# Patient Record
Sex: Female | Born: 1955
Health system: Southern US, Community
[De-identification: ages and names within clinical notes are randomized; demographics above are authoritative.]

## PROBLEM LIST (undated history)

## (undated) DIAGNOSIS — F32A Depression, unspecified: Secondary | ICD-10-CM

## (undated) DIAGNOSIS — K219 Gastro-esophageal reflux disease without esophagitis: Secondary | ICD-10-CM

## (undated) DIAGNOSIS — I10 Essential (primary) hypertension: Secondary | ICD-10-CM

## (undated) DIAGNOSIS — R0989 Other specified symptoms and signs involving the circulatory and respiratory systems: Secondary | ICD-10-CM

## (undated) DIAGNOSIS — M5126 Other intervertebral disc displacement, lumbar region: Secondary | ICD-10-CM

## (undated) DIAGNOSIS — R7303 Prediabetes: Secondary | ICD-10-CM

## (undated) DIAGNOSIS — K589 Irritable bowel syndrome without diarrhea: Secondary | ICD-10-CM

## (undated) DIAGNOSIS — E785 Hyperlipidemia, unspecified: Secondary | ICD-10-CM

## (undated) DIAGNOSIS — M1990 Unspecified osteoarthritis, unspecified site: Secondary | ICD-10-CM

## (undated) DIAGNOSIS — F329 Major depressive disorder, single episode, unspecified: Secondary | ICD-10-CM

## (undated) DIAGNOSIS — J45909 Unspecified asthma, uncomplicated: Secondary | ICD-10-CM

## (undated) DIAGNOSIS — R002 Palpitations: Secondary | ICD-10-CM

## (undated) DIAGNOSIS — J40 Bronchitis, not specified as acute or chronic: Secondary | ICD-10-CM

## (undated) DIAGNOSIS — R0609 Other forms of dyspnea: Secondary | ICD-10-CM

## (undated) DIAGNOSIS — F419 Anxiety disorder, unspecified: Secondary | ICD-10-CM

## (undated) DIAGNOSIS — K625 Hemorrhage of anus and rectum: Secondary | ICD-10-CM

## (undated) DIAGNOSIS — C50912 Malignant neoplasm of unspecified site of left female breast: Secondary | ICD-10-CM

## (undated) DIAGNOSIS — R519 Headache, unspecified: Secondary | ICD-10-CM

## (undated) DIAGNOSIS — R51 Headache: Secondary | ICD-10-CM

## (undated) HISTORY — DX: Palpitations: R00.2

## (undated) HISTORY — DX: Depression, unspecified: F32.A

## (undated) HISTORY — DX: Other specified symptoms and signs involving the circulatory and respiratory systems: R09.89

## (undated) HISTORY — DX: Irritable bowel syndrome, unspecified: K58.9

## (undated) HISTORY — DX: Hyperlipidemia, unspecified: E78.5

## (undated) HISTORY — PX: BREAST SURGERY: SHX581

## (undated) HISTORY — DX: Essential (primary) hypertension: I10

## (undated) HISTORY — DX: Other forms of dyspnea: R09.89

## (undated) HISTORY — DX: Anxiety disorder, unspecified: F41.9

## (undated) HISTORY — DX: Malignant neoplasm of unspecified site of left female breast: C50.912

## (undated) HISTORY — PX: TOE SURGERY: SHX1073

## (undated) HISTORY — DX: Hemorrhage of anus and rectum: K62.5

## (undated) HISTORY — PX: SHOULDER ARTHROSCOPY W/ LABRAL REPAIR: SHX2399

## (undated) HISTORY — PX: VAGINAL HYSTERECTOMY: SUR661

## (undated) HISTORY — DX: Major depressive disorder, single episode, unspecified: F32.9

## (undated) HISTORY — DX: Other forms of dyspnea: R06.09

---

## 2003-08-09 DIAGNOSIS — C50912 Malignant neoplasm of unspecified site of left female breast: Secondary | ICD-10-CM

## 2003-08-09 HISTORY — DX: Malignant neoplasm of unspecified site of left female breast: C50.912

## 2003-09-14 ENCOUNTER — Ambulatory Visit: Admission: RE | Admit: 2003-09-14 | Discharge: 2003-12-13 | Payer: Self-pay | Admitting: Radiation Oncology

## 2006-09-02 ENCOUNTER — Ambulatory Visit (HOSPITAL_COMMUNITY): Payer: Self-pay | Admitting: Psychiatry

## 2010-09-20 ENCOUNTER — Emergency Department (HOSPITAL_COMMUNITY)
Admission: EM | Admit: 2010-09-20 | Discharge: 2010-09-20 | Disposition: A | Discharge status: Home / Self Care | Payer: 59 | Attending: Emergency Medicine | Admitting: Emergency Medicine

## 2010-09-20 DIAGNOSIS — R002 Palpitations: Secondary | ICD-10-CM | POA: Insufficient documentation

## 2010-09-20 DIAGNOSIS — R21 Rash and other nonspecific skin eruption: Secondary | ICD-10-CM | POA: Insufficient documentation

## 2010-09-20 DIAGNOSIS — Z79899 Other long term (current) drug therapy: Secondary | ICD-10-CM | POA: Insufficient documentation

## 2010-09-20 DIAGNOSIS — R9431 Abnormal electrocardiogram [ECG] [EKG]: Secondary | ICD-10-CM | POA: Insufficient documentation

## 2010-09-20 DIAGNOSIS — R000 Tachycardia, unspecified: Secondary | ICD-10-CM | POA: Insufficient documentation

## 2010-09-20 DIAGNOSIS — L509 Urticaria, unspecified: Secondary | ICD-10-CM | POA: Insufficient documentation

## 2010-09-20 LAB — BASIC METABOLIC PANEL
BUN: 15 mg/dL (ref 6–23)
CO2: 29 mEq/L (ref 19–32)
Chloride: 103 mEq/L (ref 96–112)
Creatinine, Ser: 0.53 mg/dL (ref 0.4–1.2)
Potassium: 4.1 mEq/L (ref 3.5–5.1)

## 2010-09-20 LAB — DIFFERENTIAL
Basophils Relative: 0 % (ref 0–1)
Eosinophils Absolute: 0.2 10*3/uL (ref 0.0–0.7)
Eosinophils Relative: 1 % (ref 0–5)
Lymphocytes Relative: 28 % (ref 12–46)
Monocytes Absolute: 1.5 10*3/uL — ABNORMAL HIGH (ref 0.1–1.0)
Neutro Abs: 9.1 10*3/uL — ABNORMAL HIGH (ref 1.7–7.7)
Neutrophils Relative %: 61 % (ref 43–77)
Smear Review: ADEQUATE

## 2010-09-20 LAB — CBC
HCT: 40.6 % (ref 36.0–46.0)
Hemoglobin: 13.8 g/dL (ref 12.0–15.0)
MCV: 86.9 fL (ref 78.0–100.0)
RDW: 13.3 % (ref 11.5–15.5)
WBC: 15 10*3/uL — ABNORMAL HIGH (ref 4.0–10.5)

## 2010-09-20 LAB — POCT CARDIAC MARKERS

## 2010-10-12 ENCOUNTER — Encounter: Payer: Self-pay | Admitting: Cardiology

## 2010-10-29 ENCOUNTER — Encounter: Payer: Self-pay | Admitting: Cardiology

## 2010-10-30 ENCOUNTER — Encounter: Payer: Self-pay | Admitting: *Deleted

## 2010-10-30 ENCOUNTER — Ambulatory Visit (INDEPENDENT_AMBULATORY_CARE_PROVIDER_SITE_OTHER): Payer: 59 | Admitting: Cardiology

## 2010-10-30 ENCOUNTER — Encounter: Payer: Self-pay | Admitting: Cardiology

## 2010-10-30 VITALS — BP 119/86 | HR 108 | Ht 67.0 in | Wt 180.0 lb

## 2010-10-30 DIAGNOSIS — R002 Palpitations: Secondary | ICD-10-CM | POA: Insufficient documentation

## 2010-10-30 DIAGNOSIS — R931 Abnormal findings on diagnostic imaging of heart and coronary circulation: Secondary | ICD-10-CM | POA: Insufficient documentation

## 2010-10-30 DIAGNOSIS — I10 Essential (primary) hypertension: Secondary | ICD-10-CM | POA: Insufficient documentation

## 2010-10-30 DIAGNOSIS — R9389 Abnormal findings on diagnostic imaging of other specified body structures: Secondary | ICD-10-CM

## 2010-10-30 NOTE — Assessment & Plan Note (Signed)
She had some trivial valve regurgitation noted on her echo. This is not significant and needs no specific followup.

## 2010-10-30 NOTE — Assessment & Plan Note (Signed)
She has borderline blood pressure issues but actually is running low currently.

## 2010-10-30 NOTE — Assessment & Plan Note (Signed)
At this point she appears to have sinus tachycardia. She's had an evaluation. I think she can be managed with a low dose of beta blocker as she is. She might need to have this titrated upward or be started on Cardizem in addition to orient place of beta blocker if she has continued symptoms.

## 2010-10-30 NOTE — Progress Notes (Signed)
HPI Patient presents for evaluation of tachycardia palpitations. She has no prior cardiac history. However, she was noted to have an increased heart rate. She wore a monitor in April demonstrating occasional rates into the 150s here it I reviewed this and this appears to be sinus tachycardia. She says that this happened spontaneously. It has been happening for a couple of months. It might be several times per day. He did improve after she was started on an increased dose of atenolol. She has been switched to metoprolol. She was once seen in the emergency room and was not found at any significant dysrhythmias. I reviewed an echocardiogram done recently and it demonstrated a preserved ejection fraction with no significant valvular abnormalities. She says these episodes at rest and she cannot bring them on. They might last for 30-60 minutes. She does not describe presyncope or syncope. She's not reported chest pressure, neck or arm discomfort. I reviewed electrolytes and these were normal. She did have a normal TSH by her report.  Allergies  Allergen Reactions  . Iodinated Diagnostic Agents   . Ancef (Cefazolin Sodium) Hives and Itching    Current Outpatient Prescriptions  Medication Sig Dispense Refill  . amitriptyline (ELAVIL) 50 MG tablet Take 25 mg by mouth at bedtime.       . Calcium Carbonate (CALTRATE 600 PO) Take 2 tablets by mouth daily.        . metoprolol (TOPROL-XL) 50 MG 24 hr tablet Take 50 mg by mouth daily.        . Multiple Vitamins-Minerals (CENTRUM PO) Take 1 tablet by mouth daily.        . sertraline (ZOLOFT) 50 MG tablet Take 75 mg by mouth daily.        Marland Kitchen DISCONTD: sertraline (ZOLOFT) 100 MG tablet Take 150 mg by mouth daily.       Marland Kitchen DISCONTD: ergocalciferol (VITAMIN D2) 50000 UNITS capsule Take 50,000 Units by mouth once a week.        Marland Kitchen DISCONTD: nystatin-triamcinolone (MYCOLOG II) cream Apply 1 application topically 2 (two) times daily.        Marland Kitchen DISCONTD: Omega-3 Fatty Acids  (FISH OIL) 1200 MG CAPS Take 1 capsule by mouth daily.        Marland Kitchen DISCONTD: raloxifene (EVISTA) 60 MG tablet Take 60 mg by mouth daily.        Marland Kitchen DISCONTD: Red Yeast Rice 600 MG TABS Take 1 tablet by mouth 2 (two) times daily.          Past Medical History  Diagnosis Date  . Palpitations   . Other dyspnea and respiratory abnormality   . Cancer of left breast 08/2003    under care of Dr. Cleone Slim  . HTN (hypertension)   . Hyperlipidemia     Past Surgical History  Procedure Date  . Breast surgery     CANCER DIAG 08-2003  . Toe surgery   . Vaginal hysterectomy     Family History  Problem Relation Age of Onset  . Atrial fibrillation Mother     Pacemaker    History   Social History  . Marital Status: Single    Spouse Name: RONALD    Number of Children: 1  . Years of Education: N/A   Occupational History  . [employer]   .     Social History Main Topics  . Smoking status: Former Smoker -- 1.0 packs/day for 35 years    Types: Cigarettes    Quit date: 05/10/2008  .  Smokeless tobacco: Never Used  . Alcohol Use: Not on file  . Drug Use: Not on file  . Sexually Active: Not on file   Other Topics Concern  . Not on file   Social History Narrative  . No narrative on file    ROS:  As stated in the HPI and negative for all other systems.   PHYSICAL EXAM BP 119/86  Pulse 108  Ht 5\' 7"  (1.702 m)  Wt 180 lb (81.647 kg)  BMI 28.19 kg/m2 GENERAL:  Well appearing HEENT:  Pupils equal round and reactive, fundi not visualized, oral mucosa unremarkable NECK:  No jugular venous distention, waveform within normal limits, carotid upstroke brisk and symmetric, no bruits, no thyromegaly LYMPHATICS:  No cervical, inguinal adenopathy LUNGS:  Clear to auscultation bilaterally BACK:  No CVA tenderness CHEST:  Unremarkable HEART:  PMI not displaced or sustained,S1 and S2 within normal limits, no S3, no S4, no clicks, no rubs, no murmurs ABD:  Flat, positive bowel sounds normal in  frequency in pitch, no bruits, no rebound, no guarding, no midline pulsatile mass, no hepatomegaly, no splenomegaly EXT:  2 plus pulses throughout, no edema, no cyanosis no clubbing SKIN:  No rashes no nodules NEURO:  Cranial nerves II through XII grossly intact, motor grossly intact throughout PSYCH:  Cognitively intact, oriented to person place and time   EKG:  09/20/2010  100 sinus.  Borderline QT prolongation. Otherwise unremarkable.  ASSESSMENT AND PLAN

## 2010-10-30 NOTE — Patient Instructions (Signed)
Continue all current medications. Your physician wants you to follow up in:  4 months.  You will receive a reminder letter in the mail one-two months in advance.  If you don't receive a letter, please call our office to schedule the follow up appointment   

## 2011-03-14 ENCOUNTER — Ambulatory Visit (INDEPENDENT_AMBULATORY_CARE_PROVIDER_SITE_OTHER): Payer: 59 | Admitting: Cardiovascular Disease

## 2011-03-14 DIAGNOSIS — I10 Essential (primary) hypertension: Secondary | ICD-10-CM

## 2011-03-14 DIAGNOSIS — R002 Palpitations: Secondary | ICD-10-CM

## 2011-03-14 NOTE — Assessment & Plan Note (Signed)
followed by Dr Sherril Croon

## 2011-03-14 NOTE — Assessment & Plan Note (Signed)
No further cardiac workup, at this point in time. Patient continues to do well on current dose of Toprol, reporting breakthrough palpitations, only on rare occasion. Therefore, we will have her return to the clinic to f/u with Dr Kirke Corin, on an as needed basis.

## 2011-03-14 NOTE — Progress Notes (Signed)
HPI:  Patient returns for scheduled visit.  Since her initial referral here to Dr Antoine Poche, back in May, for evaluation of palpitations, she reports no significant change from her baseline. She continues to have palpitations only on rare occasion, and is tolerating the current dose of Toprol. No medication adjustments were made, by Dr Antoine Poche, at time of her initial visit.  She denies any interim development of DOE or exertional CP.  Allergies  Allergen Reactions  . Iodinated Diagnostic Agents   . Ancef (Cefazolin Sodium) Hives and Itching    Current Outpatient Prescriptions on File Prior to Visit  Medication Sig Dispense Refill  . amitriptyline (ELAVIL) 50 MG tablet Take 25 mg by mouth at bedtime.       . Calcium Carbonate (CALTRATE 600 PO) Take 2 tablets by mouth daily.        . metoprolol (TOPROL-XL) 50 MG 24 hr tablet Take 50 mg by mouth daily.        . Multiple Vitamins-Minerals (CENTRUM PO) Take 1 tablet by mouth daily.        . sertraline (ZOLOFT) 50 MG tablet Take 75 mg by mouth daily.          Past Medical History  Diagnosis Date  . Palpitations   . Other dyspnea and respiratory abnormality   . Cancer of left breast 08/2003    under care of Dr. Cleone Slim  . HTN (hypertension)   . Hyperlipidemia     Past Surgical History  Procedure Date  . Breast surgery     CANCER DIAG 08-2003  . Toe surgery   . Vaginal hysterectomy     History   Social History  . Marital Status: Single    Spouse Name: RONALD    Number of Children: 1  . Years of Education: N/A   Occupational History  . [employer]   .     Social History Main Topics  . Smoking status: Former Smoker -- 1.0 packs/day for 35 years    Types: Cigarettes    Quit date: 05/10/2008  . Smokeless tobacco: Never Used  . Alcohol Use: Not on file  . Drug Use: Not on file  . Sexually Active: Not on file   Other Topics Concern  . Not on file   Social History Narrative  . No narrative on file    Family History    Problem Relation Age of Onset  . Atrial fibrillation Mother     Pacemaker    ROS: Negative for exertional chest pain, DOE, orthopnea, PND, lower extremity edema, palpitations, presyncope/syncope, claudication, reflux, hematuria, hematochezia, or melena. Remaining systems reviewed, and are negative.  PHYSICAL EXAM:  BP 118/81  Pulse 81  Ht 5\' 7"  (1.702 m)  Wt 180 lb 12.8 oz (82.01 kg)  BMI 28.32 kg/m2  GENERAL: well-nourished, well-developed; NAD HEENT: NCAT, PERRLA, EOMI; sclera clear; no xanthelasma NECK: palpable bilateral carotid pulses, no bruits; no JVD; no TM LUNGS: CTA bilaterally CARDIAC: RRR (S1, S2); no significant murmurs; no rubs or gallops ABDOMEN: soft, non-tender; intact BS EXTREMETIES: intact distal pulses; no significant peripheral edema SKIN: warm/dry; no obvious rash/lesions MUSCULOSKELETAL: no joint deformity NEURO: no focal deficit; NL affect   EKG:    ASSESSMENT & PLAN:

## 2011-03-14 NOTE — Patient Instructions (Signed)
   Follow up as needed. Your physician recommends that you continue on your current medications as directed. Please refer to the Current Medication list given to you today. 

## 2012-02-20 ENCOUNTER — Ambulatory Visit (INDEPENDENT_AMBULATORY_CARE_PROVIDER_SITE_OTHER): Payer: 59 | Admitting: Internal Medicine

## 2012-02-25 ENCOUNTER — Ambulatory Visit (INDEPENDENT_AMBULATORY_CARE_PROVIDER_SITE_OTHER): Payer: 59 | Admitting: Internal Medicine

## 2012-03-10 ENCOUNTER — Ambulatory Visit (INDEPENDENT_AMBULATORY_CARE_PROVIDER_SITE_OTHER): Payer: 59 | Admitting: Internal Medicine

## 2012-03-10 ENCOUNTER — Encounter (INDEPENDENT_AMBULATORY_CARE_PROVIDER_SITE_OTHER): Payer: Self-pay | Admitting: Internal Medicine

## 2012-03-10 VITALS — BP 112/82 | HR 66 | Temp 98.0°F | Ht 67.0 in | Wt 183.9 lb

## 2012-03-10 DIAGNOSIS — K589 Irritable bowel syndrome without diarrhea: Secondary | ICD-10-CM

## 2012-03-10 DIAGNOSIS — R197 Diarrhea, unspecified: Secondary | ICD-10-CM

## 2012-03-10 MED ORDER — DICYCLOMINE HCL 10 MG PO CAPS: 10.0000 mg | ORAL_CAPSULE | Freq: Three times a day (TID) | ORAL | Status: DC

## 2012-03-10 NOTE — Progress Notes (Signed)
Subjective:     Patient ID: Kaitlyn Santana, female   DOB: Feb 09, 1956, 56 y.o.   MRN: [id number]  HPI Kaitlyn Santana presents with c/o diarrhea and crampy abdominal pain. She says she cannot hold her stool at times. She actually cannot get to the bathroom and she will become incontient. She is having 3-4 stools a day.   Her last colonoscopy was in 201 for non bloody diarrhea. : Normal terminal ileum. Scattered diverticula at sigmoid and descending colon. Small polyp ablated. No endoscopic evidence of microscopic  Colitis.  She was prescribed an Rx for Dicyclomine.   Given an Rx for Dicyclomine and she has been taking one a day which has helped some. Stool studies have been negative in the past.    Review of Systems see hpi Current Outpatient Prescriptions  Medication Sig Dispense Refill  . amitriptyline (ELAVIL) 50 MG tablet Take 25 mg by mouth at bedtime.       . meloxicam (MOBIC) 7.5 MG tablet Take 7.5 mg by mouth daily as needed.        . metoprolol (TOPROL-XL) 50 MG 24 hr tablet Take 50 mg by mouth daily.        . sertraline (ZOLOFT) 50 MG tablet Take 75 mg by mouth daily.         Past Medical History  Diagnosis Date  . Palpitations   . Other dyspnea and respiratory abnormality   . Cancer of left breast 08/2003    under care of Dr. Cleone Slim  . HTN (hypertension)   . Hyperlipidemia    Family Status  Relation Status Death Age  . Mother Alive     COPD  . Father Alive     good health   History   Social History  . Marital Status: Married    Spouse Name: RONALD    Number of Children: 1  . Years of Education: N/A   Occupational History  . [employer]   .     Social History Main Topics  . Smoking status: Former Smoker -- 1.0 packs/day for 35 years    Types: Cigarettes    Quit date: 05/10/2008  . Smokeless tobacco: Never Used   Comment: Quit in 2009  . Alcohol Use: Not on file  . Drug Use: Not on file  . Sexually Active: Not on file   Other Topics Concern  . Not on file    Social History Narrative  . No narrative on file   Past Surgical History  Procedure Date  . Breast surgery     CANCER DIAG 08-2003  . Toe surgery   . Vaginal hysterectomy    Allergies  Allergen Reactions  . Iodinated Diagnostic Agents   . Ancef (Cefazolin Sodium) Hives and Itching        Objective:   Physical Exam Filed Vitals:   03/10/12 1537  BP: 112/82  Pulse: 66  Temp: 98 F (36.7 C)  Height: 5\' 7"  (1.702 m)  Weight: 183 lb 14.4 oz (83.416 kg)  Alert and oriented. Skin warm and dry. Oral mucosa is moist.   . Sclera anicteric, conjunctivae is pink. Thyroid not enlarged. No cervical lymphadenopathy. Lungs clear. Heart regular rate and rhythm.  Abdomen is soft. Bowel sounds are positive. No hepatomegaly. No abdominal masses felt. No tenderness.  No edema to lower extremities.      Assessment:    IBS diarrhea. Hx of same. Last colonoscopy in 2010 by Dr. Karilyn Cota did not reveal microscopic colitis.  Plan:    Dicyclomine 10mg  TID with Meals. PR in 1 months. OV prn.

## 2012-03-10 NOTE — Patient Instructions (Addendum)
Dicyclomine TID 10mg . OV prn basis.

## 2012-03-18 ENCOUNTER — Encounter (INDEPENDENT_AMBULATORY_CARE_PROVIDER_SITE_OTHER): Payer: Self-pay

## 2013-02-17 ENCOUNTER — Encounter (HOSPITAL_COMMUNITY): Payer: Self-pay | Admitting: Psychiatry

## 2013-02-17 ENCOUNTER — Ambulatory Visit (INDEPENDENT_AMBULATORY_CARE_PROVIDER_SITE_OTHER): Payer: 59 | Admitting: Psychiatry

## 2013-02-17 VITALS — BP 150/98 | Ht 67.0 in | Wt 188.0 lb

## 2013-02-17 DIAGNOSIS — F339 Major depressive disorder, recurrent, unspecified: Secondary | ICD-10-CM | POA: Insufficient documentation

## 2013-02-17 DIAGNOSIS — F4310 Post-traumatic stress disorder, unspecified: Secondary | ICD-10-CM

## 2013-02-17 MED ORDER — BUPROPION HCL ER (XL) 150 MG PO TB24: 150.0000 mg | ORAL_TABLET | ORAL | Status: DC
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
## 2013-02-22 ENCOUNTER — Telehealth (HOSPITAL_COMMUNITY): Payer: Self-pay | Admitting: Psychiatry

## 2013-03-01 ENCOUNTER — Ambulatory Visit (INDEPENDENT_AMBULATORY_CARE_PROVIDER_SITE_OTHER): Payer: 59 | Admitting: Psychiatry

## 2013-03-01 DIAGNOSIS — F339 Major depressive disorder, recurrent, unspecified: Secondary | ICD-10-CM
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
## 2013-03-04 NOTE — Patient Instructions (Signed)
Discussed orally 

## 2013-03-09 ENCOUNTER — Ambulatory Visit (HOSPITAL_COMMUNITY): Payer: Self-pay | Admitting: Psychiatry

## 2013-03-10 ENCOUNTER — Ambulatory Visit (INDEPENDENT_AMBULATORY_CARE_PROVIDER_SITE_OTHER): Payer: 59 | Admitting: Psychiatry

## 2013-03-10 ENCOUNTER — Encounter (HOSPITAL_COMMUNITY): Payer: Self-pay | Admitting: Psychiatry

## 2013-03-10 VITALS — BP 128/90 | Ht 68.0 in | Wt 188.0 lb

## 2013-03-10 DIAGNOSIS — F339 Major depressive disorder, recurrent, unspecified: Secondary | ICD-10-CM

## 2013-03-10 DIAGNOSIS — F4310 Post-traumatic stress disorder, unspecified: Secondary | ICD-10-CM
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
## 2013-03-16 ENCOUNTER — Telehealth (HOSPITAL_COMMUNITY): Payer: Self-pay | Admitting: *Deleted

## 2013-03-19 ENCOUNTER — Ambulatory Visit (INDEPENDENT_AMBULATORY_CARE_PROVIDER_SITE_OTHER): Payer: 59 | Admitting: Psychology

## 2013-03-19 ENCOUNTER — Encounter (HOSPITAL_COMMUNITY): Payer: Self-pay | Admitting: Psychology

## 2013-03-19 DIAGNOSIS — F339 Major depressive disorder, recurrent, unspecified: Secondary | ICD-10-CM
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
## 2013-03-31 ENCOUNTER — Encounter (HOSPITAL_COMMUNITY): Payer: Self-pay | Admitting: Psychiatry

## 2013-03-31 ENCOUNTER — Ambulatory Visit (INDEPENDENT_AMBULATORY_CARE_PROVIDER_SITE_OTHER): Payer: 59 | Admitting: Psychiatry

## 2013-03-31 VITALS — BP 130/100 | Ht 68.0 in | Wt 189.0 lb

## 2013-03-31 DIAGNOSIS — F4310 Post-traumatic stress disorder, unspecified: Secondary | ICD-10-CM

## 2013-03-31 DIAGNOSIS — F339 Major depressive disorder, recurrent, unspecified: Secondary | ICD-10-CM

## 2013-03-31 MED ORDER — CLONAZEPAM 0.5 MG PO TABS: 0.5000 mg | ORAL_TABLET | Freq: Two times a day (BID) | ORAL | Status: DC
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
## 2013-04-01 ENCOUNTER — Ambulatory Visit (HOSPITAL_COMMUNITY): Payer: Self-pay | Admitting: Psychology

## 2013-04-06 ENCOUNTER — Encounter (HOSPITAL_COMMUNITY): Payer: Self-pay | Admitting: Psychology

## 2013-04-06 ENCOUNTER — Ambulatory Visit (INDEPENDENT_AMBULATORY_CARE_PROVIDER_SITE_OTHER): Payer: 59 | Admitting: Psychology

## 2013-04-06 DIAGNOSIS — F339 Major depressive disorder, recurrent, unspecified: Secondary | ICD-10-CM
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
## 2013-04-21 ENCOUNTER — Ambulatory Visit (HOSPITAL_COMMUNITY): Payer: Self-pay | Admitting: Psychiatry

## 2013-04-21 ENCOUNTER — Ambulatory Visit (INDEPENDENT_AMBULATORY_CARE_PROVIDER_SITE_OTHER): Payer: 59 | Admitting: Psychiatry

## 2013-04-21 ENCOUNTER — Encounter (HOSPITAL_COMMUNITY): Payer: Self-pay | Admitting: Psychiatry

## 2013-04-21 VITALS — BP 140/80 | Ht 68.0 in | Wt 190.0 lb

## 2013-04-21 DIAGNOSIS — F4310 Post-traumatic stress disorder, unspecified: Secondary | ICD-10-CM

## 2013-04-21 DIAGNOSIS — F339 Major depressive disorder, recurrent, unspecified: Secondary | ICD-10-CM

## 2013-04-21 MED ORDER — CLONAZEPAM 0.5 MG PO TABS: 0.5000 mg | ORAL_TABLET | Freq: Two times a day (BID) | ORAL | Status: DC

## 2013-04-21 MED ORDER — FLUOXETINE HCL 40 MG PO CAPS: 40.0000 mg | ORAL_CAPSULE | Freq: Every day | ORAL | Status: DC
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
## 2013-04-23 ENCOUNTER — Ambulatory Visit (HOSPITAL_COMMUNITY): Payer: Self-pay | Admitting: Psychology

## 2013-05-05 ENCOUNTER — Ambulatory Visit (HOSPITAL_COMMUNITY): Payer: Self-pay | Admitting: Psychology

## 2013-05-17 ENCOUNTER — Encounter (HOSPITAL_COMMUNITY): Payer: Self-pay | Admitting: Psychiatry

## 2013-05-17 ENCOUNTER — Ambulatory Visit (INDEPENDENT_AMBULATORY_CARE_PROVIDER_SITE_OTHER): Payer: 59 | Admitting: Psychiatry

## 2013-05-17 VITALS — BP 120/82 | Ht 68.0 in | Wt 190.0 lb

## 2013-05-17 DIAGNOSIS — F339 Major depressive disorder, recurrent, unspecified: Secondary | ICD-10-CM

## 2013-05-17 DIAGNOSIS — F4310 Post-traumatic stress disorder, unspecified: Secondary | ICD-10-CM
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
## 2013-05-24 ENCOUNTER — Ambulatory Visit (HOSPITAL_COMMUNITY): Payer: Self-pay | Admitting: Psychology

## 2013-06-28 ENCOUNTER — Ambulatory Visit (INDEPENDENT_AMBULATORY_CARE_PROVIDER_SITE_OTHER): Payer: 59 | Admitting: Psychiatry

## 2013-06-28 ENCOUNTER — Encounter (HOSPITAL_COMMUNITY): Payer: Self-pay | Admitting: Psychiatry

## 2013-06-28 VITALS — BP 130/84 | Ht 68.0 in | Wt 189.0 lb

## 2013-06-28 DIAGNOSIS — F339 Major depressive disorder, recurrent, unspecified: Secondary | ICD-10-CM

## 2013-06-28 DIAGNOSIS — F4310 Post-traumatic stress disorder, unspecified: Secondary | ICD-10-CM

## 2013-06-28 MED ORDER — CLONAZEPAM 0.5 MG PO TABS: 0.5000 mg | ORAL_TABLET | Freq: Two times a day (BID) | ORAL | Status: DC

## 2013-06-28 MED ORDER — FLUOXETINE HCL 40 MG PO CAPS: 40.0000 mg | ORAL_CAPSULE | Freq: Every day | ORAL | Status: DC

## 2013-06-28 MED ORDER — AMITRIPTYLINE HCL 50 MG PO TABS: 25.0000 mg | ORAL_TABLET | Freq: Every day | ORAL | Status: DC
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
## 2013-08-24 ENCOUNTER — Ambulatory Visit (HOSPITAL_COMMUNITY): Payer: Self-pay | Admitting: Psychiatry

## 2013-08-24 ENCOUNTER — Ambulatory Visit (INDEPENDENT_AMBULATORY_CARE_PROVIDER_SITE_OTHER): Payer: 59 | Admitting: Psychiatry

## 2013-08-24 ENCOUNTER — Encounter (HOSPITAL_COMMUNITY): Payer: Self-pay | Admitting: Psychiatry

## 2013-08-24 VITALS — BP 110/80 | Ht 68.0 in | Wt 188.0 lb

## 2013-08-24 DIAGNOSIS — F4310 Post-traumatic stress disorder, unspecified: Secondary | ICD-10-CM

## 2013-08-24 DIAGNOSIS — F339 Major depressive disorder, recurrent, unspecified: Secondary | ICD-10-CM

## 2013-08-24 MED ORDER — CLONAZEPAM 0.5 MG PO TABS: 0.5000 mg | ORAL_TABLET | Freq: Two times a day (BID) | ORAL | Status: DC

## 2013-08-24 MED ORDER — AMITRIPTYLINE HCL 50 MG PO TABS
25.0000 mg | ORAL_TABLET | Freq: Every day | ORAL | Status: DC
Start: 2013-08-24 — End: 2013-12-17

## 2013-08-24 MED ORDER — FLUOXETINE HCL 40 MG PO CAPS: 40.0000 mg | ORAL_CAPSULE | Freq: Every day | ORAL | Status: DC
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
## 2013-11-19 ENCOUNTER — Ambulatory Visit (HOSPITAL_COMMUNITY): Payer: Self-pay | Admitting: Psychiatry

## 2013-12-13 ENCOUNTER — Encounter (HOSPITAL_COMMUNITY): Payer: Self-pay | Admitting: Psychiatry

## 2013-12-13 ENCOUNTER — Telehealth (HOSPITAL_COMMUNITY): Payer: Self-pay | Admitting: *Deleted

## 2013-12-13 NOTE — Telephone Encounter (Signed)
sent 

## 2013-12-17 ENCOUNTER — Encounter (HOSPITAL_COMMUNITY): Payer: Self-pay | Admitting: Psychiatry

## 2013-12-17 ENCOUNTER — Ambulatory Visit (INDEPENDENT_AMBULATORY_CARE_PROVIDER_SITE_OTHER): Payer: 59 | Admitting: Psychiatry

## 2013-12-17 VITALS — BP 130/84 | Ht 68.0 in | Wt 180.0 lb

## 2013-12-17 DIAGNOSIS — F331 Major depressive disorder, recurrent, moderate: Secondary | ICD-10-CM

## 2013-12-17 MED ORDER — AMITRIPTYLINE HCL 50 MG PO TABS: 50.0000 mg | ORAL_TABLET | Freq: Every day | ORAL | Status: DC

## 2013-12-17 MED ORDER — FLUOXETINE HCL 40 MG PO CAPS: 40.0000 mg | ORAL_CAPSULE | Freq: Every day | ORAL | Status: DC

## 2013-12-17 MED ORDER — CLONAZEPAM 0.5 MG PO TABS: 0.5000 mg | ORAL_TABLET | Freq: Two times a day (BID) | ORAL | Status: DC
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
## 2014-04-08 ENCOUNTER — Other Ambulatory Visit (HOSPITAL_COMMUNITY): Payer: Self-pay | Admitting: Psychiatry

## 2014-04-29 ENCOUNTER — Encounter (HOSPITAL_COMMUNITY): Payer: Self-pay | Admitting: Psychiatry

## 2014-04-29 ENCOUNTER — Ambulatory Visit (INDEPENDENT_AMBULATORY_CARE_PROVIDER_SITE_OTHER): Payer: 59 | Admitting: Psychiatry

## 2014-04-29 VITALS — BP 105/83 | HR 83 | Ht 66.5 in | Wt 188.0 lb

## 2014-04-29 DIAGNOSIS — F4310 Post-traumatic stress disorder, unspecified: Secondary | ICD-10-CM

## 2014-04-29 DIAGNOSIS — F331 Major depressive disorder, recurrent, moderate: Secondary | ICD-10-CM

## 2014-04-29 MED ORDER — AMITRIPTYLINE HCL 50 MG PO TABS: 50.0000 mg | ORAL_TABLET | Freq: Every day | ORAL | Status: DC

## 2014-04-29 MED ORDER — FLUOXETINE HCL 40 MG PO CAPS: 40.0000 mg | ORAL_CAPSULE | Freq: Every day | ORAL | Status: DC

## 2014-04-29 MED ORDER — CLONAZEPAM 0.5 MG PO TABS: 0.5000 mg | ORAL_TABLET | Freq: Two times a day (BID) | ORAL | Status: DC
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
## 2014-08-29 ENCOUNTER — Ambulatory Visit (HOSPITAL_COMMUNITY): Payer: Self-pay | Admitting: Psychiatry

## 2014-09-12 ENCOUNTER — Other Ambulatory Visit (HOSPITAL_COMMUNITY): Payer: Self-pay | Admitting: Psychiatry

## 2014-09-13 ENCOUNTER — Encounter (HOSPITAL_COMMUNITY): Payer: Self-pay | Admitting: Psychiatry

## 2014-09-13 ENCOUNTER — Ambulatory Visit (HOSPITAL_COMMUNITY): Payer: Self-pay | Admitting: Psychiatry

## 2014-09-13 ENCOUNTER — Ambulatory Visit (INDEPENDENT_AMBULATORY_CARE_PROVIDER_SITE_OTHER): Payer: 59 | Admitting: Psychiatry

## 2014-09-13 VITALS — BP 110/82 | Ht 66.0 in | Wt 192.0 lb

## 2014-09-13 DIAGNOSIS — F4310 Post-traumatic stress disorder, unspecified: Secondary | ICD-10-CM

## 2014-09-13 DIAGNOSIS — F331 Major depressive disorder, recurrent, moderate: Secondary | ICD-10-CM

## 2014-09-13 MED ORDER — FLUOXETINE HCL 40 MG PO CAPS: 40.0000 mg | ORAL_CAPSULE | Freq: Every day | ORAL | Status: DC

## 2014-09-13 MED ORDER — CLONAZEPAM 0.5 MG PO TABS: 0.5000 mg | ORAL_TABLET | Freq: Two times a day (BID) | ORAL | Status: DC

## 2014-09-13 MED ORDER — AMITRIPTYLINE HCL 50 MG PO TABS: 50.0000 mg | ORAL_TABLET | Freq: Every day | ORAL | Status: DC
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
## 2015-01-10 ENCOUNTER — Other Ambulatory Visit (HOSPITAL_COMMUNITY): Payer: Self-pay | Admitting: Psychiatry

## 2015-01-13 ENCOUNTER — Ambulatory Visit (INDEPENDENT_AMBULATORY_CARE_PROVIDER_SITE_OTHER): Payer: 59 | Admitting: Psychiatry

## 2015-01-13 ENCOUNTER — Other Ambulatory Visit (HOSPITAL_COMMUNITY): Payer: Self-pay | Admitting: Psychiatry

## 2015-01-13 ENCOUNTER — Encounter (HOSPITAL_COMMUNITY): Payer: Self-pay | Admitting: Psychiatry

## 2015-01-13 VITALS — BP 113/82 | HR 95 | Ht 66.0 in | Wt 183.4 lb

## 2015-01-13 DIAGNOSIS — F331 Major depressive disorder, recurrent, moderate: Secondary | ICD-10-CM

## 2015-01-13 DIAGNOSIS — F4310 Post-traumatic stress disorder, unspecified: Secondary | ICD-10-CM

## 2015-01-13 MED ORDER — CLONAZEPAM 0.5 MG PO TABS: 0.5000 mg | ORAL_TABLET | Freq: Two times a day (BID) | ORAL | Status: DC

## 2015-01-13 MED ORDER — FLUOXETINE HCL 40 MG PO CAPS: 40.0000 mg | ORAL_CAPSULE | Freq: Every day | ORAL | Status: DC

## 2015-01-13 MED ORDER — AMITRIPTYLINE HCL 50 MG PO TABS: 50.0000 mg | ORAL_TABLET | Freq: Every day | ORAL | Status: DC
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
## 2015-03-15 ENCOUNTER — Ambulatory Visit (INDEPENDENT_AMBULATORY_CARE_PROVIDER_SITE_OTHER): Payer: 59 | Admitting: Psychiatry

## 2015-03-15 ENCOUNTER — Ambulatory Visit (HOSPITAL_COMMUNITY): Payer: Self-pay | Admitting: Psychiatry

## 2015-03-15 ENCOUNTER — Encounter (HOSPITAL_COMMUNITY): Payer: Self-pay | Admitting: Psychiatry

## 2015-03-15 VITALS — BP 113/78 | HR 74 | Ht 66.0 in | Wt 180.8 lb

## 2015-03-15 DIAGNOSIS — F331 Major depressive disorder, recurrent, moderate: Secondary | ICD-10-CM

## 2015-03-15 DIAGNOSIS — F4310 Post-traumatic stress disorder, unspecified: Secondary | ICD-10-CM | POA: Diagnosis not present

## 2015-03-15 MED ORDER — FLUOXETINE HCL 40 MG PO CAPS: 40.0000 mg | ORAL_CAPSULE | Freq: Every day | ORAL | Status: DC

## 2015-03-15 MED ORDER — AMITRIPTYLINE HCL 50 MG PO TABS: 50.0000 mg | ORAL_TABLET | Freq: Every day | ORAL | Status: DC

## 2015-03-15 MED ORDER — CLONAZEPAM 0.5 MG PO TABS: 0.5000 mg | ORAL_TABLET | Freq: Two times a day (BID) | ORAL | Status: DC
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
## 2015-06-14 ENCOUNTER — Telehealth (HOSPITAL_COMMUNITY): Payer: Self-pay | Admitting: *Deleted

## 2015-06-14 NOTE — Telephone Encounter (Signed)
pt called on 06-13-15 at 4:51pm and lm stating she would like to resch her appt with Dr. Harrington Challenger for 06-15-15. Called pt back at 8:10am on 06-14-15 and mother picked up. Informed mother to have pt call back and mother stated pt was sleep but will let her know.

## 2015-06-14 NOTE — Telephone Encounter (Signed)
Opened in Error.

## 2015-06-15 ENCOUNTER — Ambulatory Visit (HOSPITAL_COMMUNITY): Payer: Self-pay | Admitting: Psychiatry

## 2015-07-17 ENCOUNTER — Telehealth (HOSPITAL_COMMUNITY): Payer: Self-pay | Admitting: *Deleted

## 2015-07-17 NOTE — Telephone Encounter (Signed)
You may send in one month supply with 2 refills

## 2015-07-17 NOTE — Telephone Encounter (Signed)
Pharmacy requesting refills for pt Clonazepam 0.5 mg BID. Pt medication last printed 03-15-15 with 3 refills. Pt pharmacy number is [phone].

## 2015-07-21 ENCOUNTER — Other Ambulatory Visit (HOSPITAL_COMMUNITY): Payer: Self-pay | Admitting: Psychiatry

## 2015-07-27 ENCOUNTER — Telehealth (HOSPITAL_COMMUNITY): Payer: Self-pay | Admitting: *Deleted

## 2015-07-27 MED ORDER — CLONAZEPAM 0.5 MG PO TABS: 0.5000 mg | ORAL_TABLET | Freq: Two times a day (BID) | ORAL | Status: DC

## 2015-07-27 NOTE — Telephone Encounter (Signed)
Per Dr. Harrington Challenger to call in pt klonopin with 2 refills. Called pt pharmacy and spoke with [place].

## 2015-07-27 NOTE — Telephone Encounter (Signed)
Medication called into pharmacy and spoke with [place]

## 2015-08-07 ENCOUNTER — Encounter (HOSPITAL_COMMUNITY): Payer: Self-pay | Admitting: Psychiatry

## 2015-08-07 ENCOUNTER — Ambulatory Visit (INDEPENDENT_AMBULATORY_CARE_PROVIDER_SITE_OTHER): Payer: 59 | Admitting: Psychiatry

## 2015-08-07 VITALS — BP 117/79 | HR 101 | Ht 66.0 in | Wt 191.4 lb

## 2015-08-07 DIAGNOSIS — F331 Major depressive disorder, recurrent, moderate: Secondary | ICD-10-CM

## 2015-08-07 DIAGNOSIS — F4310 Post-traumatic stress disorder, unspecified: Secondary | ICD-10-CM

## 2015-08-07 MED ORDER — FLUOXETINE HCL 40 MG PO CAPS: 40.0000 mg | ORAL_CAPSULE | Freq: Every day | ORAL | Status: DC

## 2015-08-07 MED ORDER — AMITRIPTYLINE HCL 50 MG PO TABS: 50.0000 mg | ORAL_TABLET | Freq: Every day | ORAL | Status: DC
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
## 2015-08-29 ENCOUNTER — Ambulatory Visit (HOSPITAL_COMMUNITY): Payer: Self-pay | Admitting: Psychiatry

## 2015-09-08 ENCOUNTER — Encounter (HOSPITAL_COMMUNITY): Payer: Self-pay | Admitting: *Deleted

## 2015-09-08 ENCOUNTER — Ambulatory Visit (HOSPITAL_COMMUNITY): Payer: Self-pay | Admitting: Psychiatry

## 2015-09-13 ENCOUNTER — Ambulatory Visit (HOSPITAL_COMMUNITY): Payer: Self-pay | Admitting: Psychiatry

## 2015-09-15 ENCOUNTER — Ambulatory Visit (HOSPITAL_COMMUNITY): Payer: Self-pay | Admitting: Psychiatry

## 2015-10-02 ENCOUNTER — Telehealth (HOSPITAL_COMMUNITY): Payer: Self-pay | Admitting: *Deleted

## 2015-10-23 ENCOUNTER — Ambulatory Visit (INDEPENDENT_AMBULATORY_CARE_PROVIDER_SITE_OTHER): Payer: 59 | Admitting: Psychiatry

## 2015-10-23 ENCOUNTER — Encounter (HOSPITAL_COMMUNITY): Payer: Self-pay | Admitting: Psychiatry

## 2015-10-23 VITALS — BP 130/84 | Ht 66.0 in | Wt 191.4 lb

## 2015-10-23 DIAGNOSIS — F331 Major depressive disorder, recurrent, moderate: Secondary | ICD-10-CM

## 2015-10-23 DIAGNOSIS — F4310 Post-traumatic stress disorder, unspecified: Secondary | ICD-10-CM

## 2015-10-23 MED ORDER — FLUOXETINE HCL 40 MG PO CAPS: 40.0000 mg | ORAL_CAPSULE | Freq: Every day | ORAL | Status: DC

## 2015-10-23 MED ORDER — AMITRIPTYLINE HCL 50 MG PO TABS: 50.0000 mg | ORAL_TABLET | Freq: Every day | ORAL | Status: DC
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
## 2016-01-23 ENCOUNTER — Ambulatory Visit (HOSPITAL_COMMUNITY): Payer: Self-pay | Admitting: Psychiatry

## 2016-02-15 ENCOUNTER — Telehealth (HOSPITAL_COMMUNITY): Payer: Self-pay | Admitting: *Deleted

## 2016-02-15 NOTE — Telephone Encounter (Signed)
Called pt to inform her that provider will not be in office during appt 02-19-16. Unable to reach pt on home due to no voicemail on home number na d memory full on mobile number.

## 2016-02-19 ENCOUNTER — Ambulatory Visit (HOSPITAL_COMMUNITY): Payer: Self-pay | Admitting: Psychiatry

## 2016-02-28 ENCOUNTER — Encounter (HOSPITAL_COMMUNITY): Payer: Self-pay | Admitting: Psychiatry

## 2016-02-28 ENCOUNTER — Ambulatory Visit (INDEPENDENT_AMBULATORY_CARE_PROVIDER_SITE_OTHER): Payer: 59 | Admitting: Psychiatry

## 2016-02-28 VITALS — BP 116/79 | HR 107 | Ht 66.0 in | Wt 187.6 lb

## 2016-02-28 DIAGNOSIS — F4310 Post-traumatic stress disorder, unspecified: Secondary | ICD-10-CM | POA: Diagnosis not present

## 2016-02-28 DIAGNOSIS — F331 Major depressive disorder, recurrent, moderate: Secondary | ICD-10-CM

## 2016-02-28 MED ORDER — AMITRIPTYLINE HCL 50 MG PO TABS: 50.0000 mg | ORAL_TABLET | Freq: Every day | ORAL | 3 refills | Status: DC

## 2016-02-28 MED ORDER — CLONAZEPAM 0.5 MG PO TABS: 0.5000 mg | ORAL_TABLET | Freq: Two times a day (BID) | ORAL | 3 refills | Status: DC

## 2016-02-28 MED ORDER — FLUOXETINE HCL 40 MG PO CAPS: 40.0000 mg | ORAL_CAPSULE | Freq: Every day | ORAL | 3 refills | Status: DC
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
## 2016-06-28 ENCOUNTER — Ambulatory Visit (HOSPITAL_COMMUNITY): Payer: Self-pay | Admitting: Psychiatry

## 2016-10-02 ENCOUNTER — Encounter (INDEPENDENT_AMBULATORY_CARE_PROVIDER_SITE_OTHER): Payer: Self-pay

## 2016-10-02 ENCOUNTER — Encounter: Payer: Self-pay | Admitting: Neurology

## 2016-10-02 ENCOUNTER — Ambulatory Visit (INDEPENDENT_AMBULATORY_CARE_PROVIDER_SITE_OTHER): Payer: 59 | Admitting: Neurology

## 2016-10-02 VITALS — BP 126/81 | HR 88 | Ht 66.0 in | Wt 182.8 lb

## 2016-10-02 DIAGNOSIS — R202 Paresthesia of skin: Secondary | ICD-10-CM

## 2016-10-02 DIAGNOSIS — R200 Anesthesia of skin: Secondary | ICD-10-CM | POA: Diagnosis not present

## 2016-10-02 DIAGNOSIS — R29898 Other symptoms and signs involving the musculoskeletal system: Secondary | ICD-10-CM

## 2016-10-02 DIAGNOSIS — R269 Unspecified abnormalities of gait and mobility: Secondary | ICD-10-CM

## 2016-10-02 MED ORDER — AMITRIPTYLINE HCL 50 MG PO TABS: 100.0000 mg | ORAL_TABLET | Freq: Every day | ORAL | 3 refills | Status: DC

## 2016-10-02 NOTE — Progress Notes (Signed)
[facility] [address] [place]. [place] [address] [phone]       OFFICE CONSULT NOTE  Ms. BREE HEINZELMAN Date of Birth:  1956/04/22 Medical Record Number:  [id number]   Referring MD:  Jerene Bears Reason for Referral:  Right leg numbness ,weakness  HPI: Ms Kemler is a 61 year Caucasian lady who started having right leg numbness weakness 2 weeks ago. She states she is walking to work when all of a sudden the right leg became numb from the hip to toe. Initially she said the whole leg was numb but later on during the visit when asked to further characterize it she states that it was more lateral aspect of the right, thigh and foot. She also had some subjective weakness lasted for a few days she is asked to be seen in the emergency room at [facility]. Code stroke was called and she was seen by teleneurologist  Dr. Terrill Mohr noted NIH stroke scale of 5 with right leg weakness and numbness. CT angiogram of the brain was obtained that showed no large vessel stenosis or occlusion. MRI is pending. I have personally reviewed imaging films thru [facility] P[facility] and reviewed records thru care everywhere . MRI did not show an acute stroke only mild changes of small vessel disease. Patient subsequently had carotid ultrasound done which was also unremarkable. States her weakness has improved but she continues to have intermittent tingling in the right leg. This can happen. He is several times a day. The last few months improves with rest. She has not tried any specific medications for her paresthesias. She does take Elavil 50 mg at night for migraine prevention. for several years  ROS:   14 system review of systems is positive for  numbness, leg weakness, gait difficulty and all other systems negative PMH:  Past Medical History:  Diagnosis Date  . Anxiety   . Cancer of left breast ([place]) 08/2003   under care of Dr. Sonny Dandy  . Depression   . HTN (hypertension)   . Hyperlipidemia     . Irritable bowel disease   . Other dyspnea and respiratory abnormality   . Palpitations     Social History:  Social History   Social History  . Marital status: Married    Spouse name: RONALD  . Number of children: 1  . Years of education: N/A   Occupational History  . [employer]   .  [employer]   Social History Main Topics  . Smoking status: Former Smoker    Packs/day: 1.00    Years: 35.00    Types: Cigarettes    Quit date: 05/10/2008  . Smokeless tobacco: Never Used     Comment: Quit in 2009  . Alcohol use No  . Drug use: No  . Sexual activity: Yes   Other Topics Concern  . Not on file   Social History Narrative  . No narrative on file    Medications:   Current Outpatient Prescriptions on File Prior to Visit  Medication Sig Dispense Refill  . clonazePAM (KLONOPIN) 0.5 MG tablet Take 1 tablet (0.5 mg total) by mouth 2 (two) times daily. 60 tablet 3  . FLUoxetine (PROZAC) 40 MG capsule Take 1 capsule (40 mg total) by mouth daily. 30 capsule 3  . metoprolol (TOPROL-XL) 50 MG 24 hr tablet Take 50 mg by mouth daily.      . Multiple Vitamin (MULTIVITAMIN WITH MINERALS) TABS tablet Take 1 tablet by mouth daily.    Marland Kitchen  pantoprazole (PROTONIX) 40 MG tablet Take by mouth daily.    . simvastatin (ZOCOR) 20 MG tablet Take 20 mg by mouth daily.      No current facility-administered medications on file prior to visit.     Allergies:   Allergies  Allergen Reactions  . Iodinated Diagnostic Agents   . Ancef [Cefazolin Sodium] Hives and Itching    Physical Exam General: well developed, well nourished, middle-aged Caucasian lady seated, in no evident distress Head: head normocephalic and atraumatic.   Neck: supple with no carotid or supraclavicular bruits Cardiovascular: regular rate and rhythm, no murmurs Musculoskeletal: no deformity Skin:  no rash/petichiae Vascular:  Normal pulses all extremities  Neurologic Exam Mental Status: Awake and fully alert.  Oriented to place and time. Recent and remote memory intact. Attention span, concentration and fund of knowledge appropriate. Mood and affect appropriate.  Cranial Nerves: Fundoscopic exam reveals sharp disc margins. Pupils equal, briskly reactive to light. Extraocular movements full without nystagmus. Visual fields full to confrontation. Hearing intact. Facial sensation intact. Face, tongue, palate moves normally and symmetrically.  Motor: Normal bulk and tone. Normal strength in all tested extremity muscles. Mild subjective weakness of right leg ankle dorsiflexors and toe extensors with giveaway Sensory.: intact to touch , pinprick , position and vibratory sensation. Except diminished touch pinprick and position and vibration sense over the toes on the right side only  Coordination: Rapid alternating movements normal in all extremities. Finger-to-nose and heel-to-shin performed accurately bilaterally. Gait and Station: Arises from chair without difficulty. Stance is normal. Gait demonstrates normal stride length and balance . Able to heel, toe and tandem walk without difficulty.  Reflexes: 3+ and symmetric. Toes downgoing.      ASSESSMENT: 61 year old lady with transient episode of right leg weakness now followed by intermittent right leg and hand paresthesias of unclear etiology. Neurological exam is significant only for brisk hyperreflexia and mild sensory loss in the right foot Compressive spinal lesion versus lumbar radiculopathy the differential.    PLAN: I had a long discussion with the patient and husband regarding her intermittent right leg numbness and recent episode of right leg weakness and discussed my examination findings and differential diagnosis. I recommend checking MRI of her spine rule out compressive lesions as well as check EMG nerve conduction study to look for any radiculopathy. Increase amitriptyline dose to 100 mg at night to help with paresthesias. If this is not effective  may consider adding Topamax later Patient is clearly significantly disabled from her paresthesias and gait difficulty she was asked to stay out of work next follow-up visit with the she will return for follow-up in 2 months or call earlier if necessary. Greater than 50% time during this 45 minute consultation visit was spent on counseling and coordination of care about her leg numbness, weakness and answering questions Antony Contras, MD  [facility] [address] [place], [place] [address]  Phone [phone] Fax [phone] Note: This document was prepared with digital dictation and possible smart phrase technology. Any transcriptional errors that result from this process are unintentional.

## 2016-10-02 NOTE — Patient Instructions (Signed)
I had a long discussion with the patient and husband regarding her intermittent right leg numbness and recent episode of right leg weakness and discussed my examination findings and differential diagnosis. I recommend checking MRI of her spine rule out compressive lesions as well as check EMG nerve conduction study to look for any radiculopathy. Increase amitriptyline dose to 100 mg at night to help with paresthesias. If this is not effective may consider adding Topamax later Patient is clearly significantly disabled from her paresthesias and gait difficulty she was asked to stay out of work next follow-up visit with the she will return for follow-up in 2 months or call earlier if necessary.  Paresthesia Paresthesia is an abnormal burning or prickling sensation. This sensation is generally felt in the hands, arms, legs, or feet. However, it may occur in any part of the body. Usually, it is not painful. The feeling may be described as:  Tingling or numbness.  Pins and needles.  Skin crawling.  Buzzing.  Limbs falling asleep.  Itching. Most people experience temporary (transient) paresthesia at some time in their lives. Paresthesia may occur when you breathe too quickly (hyperventilation). It can also occur without any apparent cause. Commonly, paresthesia occurs when pressure is placed on a nerve. The sensation quickly goes away after the pressure is removed. For some people, however, paresthesia is a long-lasting (chronic) condition that is caused by an underlying disorder. If you continue to have paresthesia, you may need further medical evaluation. Follow these instructions at home: Watch your condition for any changes. Taking the following actions may help to lessen any discomfort that you are feeling:  Avoid drinking alcohol.  Try acupuncture or massage to help relieve your symptoms.  Keep all follow-up visits as directed by your health care provider. This is important. Contact a health  care provider if:  You continue to have episodes of paresthesia.  Your burning or prickling feeling gets worse when you walk.  You have pain, cramps, or dizziness.  You develop a rash. Get help right away if:  You feel weak.  You have trouble walking or moving.  You have problems with speech, understanding, or vision.  You feel confused.  You cannot control your bladder or bowel movements.  You have numbness after an injury.  You faint. This information is not intended to replace advice given to you by your health care provider. Make sure you discuss any questions you have with your health care provider. Document Released: 05/17/2002 Document Revised: 11/02/2015 Document Reviewed: 05/23/2014 Elsevier Interactive Patient Education  2017 [employer].

## 2016-10-04 ENCOUNTER — Telehealth: Payer: Self-pay

## 2016-10-04 NOTE — Telephone Encounter (Signed)
Rn fax Dr. Leonie Man office notes and letter to her job. Pt is out of work till 12/27/2016 till next app pending further testing from Dr. Leonie Man.

## 2016-10-11 ENCOUNTER — Telehealth: Payer: Self-pay | Admitting: Neurology

## 2016-10-11 NOTE — Telephone Encounter (Signed)
Patient called office returning Emily's call in reference to scheduling Mri's.  Please call

## 2016-10-14 NOTE — Telephone Encounter (Signed)
I called the patient back to schedule her MRI she is scheduled for 10/16/16 at our [place] mobile unit.. MR Cervical spine wo contrast [employer] Auth: [id number] (exp. 10/03/16 to 11/17/16) , MR Thoracic spine wo contrast [employer] Auth: [id number] (exp. 10/03/16  to 11/17/16) & MR Lumbar spine wo contrast [employer] Auth: [id number] (exp. 10/03/16 to 11/17/16)- Dr. Leonie Man EE

## 2016-10-16 ENCOUNTER — Ambulatory Visit (INDEPENDENT_AMBULATORY_CARE_PROVIDER_SITE_OTHER): Payer: 59

## 2016-10-16 ENCOUNTER — Other Ambulatory Visit (HOSPITAL_COMMUNITY): Payer: Self-pay | Admitting: Psychiatry

## 2016-10-16 DIAGNOSIS — R202 Paresthesia of skin: Secondary | ICD-10-CM | POA: Diagnosis not present

## 2016-10-16 DIAGNOSIS — R200 Anesthesia of skin: Secondary | ICD-10-CM

## 2016-10-16 DIAGNOSIS — R269 Unspecified abnormalities of gait and mobility: Secondary | ICD-10-CM

## 2016-10-18 ENCOUNTER — Telehealth: Payer: Self-pay | Admitting: Neurology

## 2016-10-18 NOTE — Telephone Encounter (Signed)
I called the patient and gave her the results of MRI scan also likely thoracic and lumbar spine. She has severe central disc herniation at L4-5 with bilateral foraminal and spinal cord compression. She has an appointment with Dr. Sherwood Gambler next week I have advised her to keep that appointment and discuss possible surgery. She voiced understanding. She was advised to go to the emergency room in case she had severe pain or worsening of her leg weakness and gait difficulty she'll expressive understanding

## 2016-10-22 NOTE — Telephone Encounter (Signed)
Garvin Fila, MD      10/18/16 1:53 PM  Note    I called the patient and gave her the results of MRI scan also likely thoracic and lumbar spine. She has severe central disc herniation at L4-5 with bilateral foraminal and spinal cord compression. She has an appointment with Dr. Sherwood Gambler next week I have advised her to keep that appointment and discuss possible surgery. She voiced understanding. She was advised to go to the emergency room in case she had severe pain or worsening of her leg weakness and gait difficulty she'll expressive understanding

## 2016-10-25 ENCOUNTER — Other Ambulatory Visit: Payer: Self-pay | Admitting: Neurosurgery

## 2016-10-30 ENCOUNTER — Encounter (HOSPITAL_COMMUNITY): Payer: Self-pay | Admitting: *Deleted

## 2016-10-30 ENCOUNTER — Encounter: Payer: 59 | Admitting: Neurology

## 2016-10-30 NOTE — Anesthesia Preprocedure Evaluation (Addendum)
Anesthesia Evaluation  Patient identified by MRN, date of birth, ID band Patient awake    Reviewed: Allergy & Precautions, NPO status , Patient's Chart, lab work & pertinent test results  Airway Mallampati: III  TM Distance: >3 FB Neck ROM: Full    Dental  (+) Teeth Intact, Dental Advisory Given   Pulmonary asthma , former smoker,    breath sounds clear to auscultation       Cardiovascular hypertension, Pt. on home beta blockers  Rhythm:Regular Rate:Normal     Neuro/Psych  Headaches, PSYCHIATRIC DISORDERS Anxiety Depression    GI/Hepatic Neg liver ROS, GERD  Medicated,  Endo/Other  negative endocrine ROS  Renal/GU negative Renal ROS  negative genitourinary   Musculoskeletal  (+) Arthritis , Osteoarthritis,    Abdominal   Peds negative pediatric ROS (+)  Hematology negative hematology ROS (+)   Anesthesia Other Findings Day of surgery medications reviewed with the patient.  Reproductive/Obstetrics                            Anesthesia Physical Anesthesia Plan  ASA: III  Anesthesia Plan: General   Post-op Pain Management:    Induction: Intravenous  Airway Management Planned: Oral ETT  Additional Equipment:   Intra-op Plan:   Post-operative Plan: Extubation in OR  Informed Consent: I have reviewed the patients History and Physical, chart, labs and discussed the procedure including the risks, benefits and alternatives for the proposed anesthesia with the patient or authorized representative who has indicated his/her understanding and acceptance.   Dental advisory given  Plan Discussed with: CRNA  Anesthesia Plan Comments:         Anesthesia Quick Evaluation

## 2016-10-30 NOTE — Progress Notes (Signed)
Pt denies SOB, chest pain, and being under the care of a cardiologist. Pt denies having a stress test, echo and cardiac cath. Pt denies having an EKG and chest x ray within the last year. Pt denies having recent labs. Pt made aware to stop taking  Aspirin, vitamins, fish oil and herbal medications. Do not take any NSAIDs ie: Ibuprofen, Advil, Naproxen  ( Anaprox/Aleve ) or any medication containing Aspirin. Pt verbalized understanding of all pre-op instructions.

## 2016-10-31 ENCOUNTER — Encounter (HOSPITAL_COMMUNITY)
Admission: RE | Disposition: A | Discharge status: Home / Self Care | Payer: Self-pay | Source: Ambulatory Visit | Attending: Neurosurgery

## 2016-10-31 ENCOUNTER — Observation Stay (HOSPITAL_COMMUNITY)
Admission: RE | Admit: 2016-10-31 | Discharge: 2016-11-01 | Disposition: A | Discharge status: Home / Self Care | Payer: 59 | Source: Ambulatory Visit | Attending: Neurosurgery | Admitting: Neurosurgery

## 2016-10-31 ENCOUNTER — Ambulatory Visit (HOSPITAL_COMMUNITY): Payer: 59 | Admitting: Anesthesiology

## 2016-10-31 ENCOUNTER — Encounter (HOSPITAL_COMMUNITY): Payer: Self-pay | Admitting: Certified Registered Nurse Anesthetist

## 2016-10-31 ENCOUNTER — Ambulatory Visit (HOSPITAL_COMMUNITY): Payer: 59

## 2016-10-31 DIAGNOSIS — M1990 Unspecified osteoarthritis, unspecified site: Secondary | ICD-10-CM | POA: Insufficient documentation

## 2016-10-31 DIAGNOSIS — F419 Anxiety disorder, unspecified: Secondary | ICD-10-CM | POA: Insufficient documentation

## 2016-10-31 DIAGNOSIS — K580 Irritable bowel syndrome with diarrhea: Secondary | ICD-10-CM | POA: Diagnosis not present

## 2016-10-31 DIAGNOSIS — Z823 Family history of stroke: Secondary | ICD-10-CM | POA: Diagnosis not present

## 2016-10-31 DIAGNOSIS — Z888 Allergy status to other drugs, medicaments and biological substances status: Secondary | ICD-10-CM | POA: Diagnosis not present

## 2016-10-31 DIAGNOSIS — K219 Gastro-esophageal reflux disease without esophagitis: Secondary | ICD-10-CM | POA: Insufficient documentation

## 2016-10-31 DIAGNOSIS — J45909 Unspecified asthma, uncomplicated: Secondary | ICD-10-CM | POA: Diagnosis not present

## 2016-10-31 DIAGNOSIS — I10 Essential (primary) hypertension: Secondary | ICD-10-CM | POA: Diagnosis not present

## 2016-10-31 DIAGNOSIS — F339 Major depressive disorder, recurrent, unspecified: Secondary | ICD-10-CM | POA: Diagnosis not present

## 2016-10-31 DIAGNOSIS — M5126 Other intervertebral disc displacement, lumbar region: Secondary | ICD-10-CM | POA: Diagnosis present

## 2016-10-31 DIAGNOSIS — Z90710 Acquired absence of both cervix and uterus: Secondary | ICD-10-CM | POA: Diagnosis not present

## 2016-10-31 DIAGNOSIS — Z818 Family history of other mental and behavioral disorders: Secondary | ICD-10-CM | POA: Insufficient documentation

## 2016-10-31 DIAGNOSIS — Z853 Personal history of malignant neoplasm of breast: Secondary | ICD-10-CM | POA: Diagnosis not present

## 2016-10-31 DIAGNOSIS — M48061 Spinal stenosis, lumbar region without neurogenic claudication: Secondary | ICD-10-CM | POA: Diagnosis not present

## 2016-10-31 DIAGNOSIS — M5116 Intervertebral disc disorders with radiculopathy, lumbar region: Secondary | ICD-10-CM | POA: Diagnosis not present

## 2016-10-31 DIAGNOSIS — M5136 Other intervertebral disc degeneration, lumbar region: Secondary | ICD-10-CM | POA: Diagnosis not present

## 2016-10-31 DIAGNOSIS — Z419 Encounter for procedure for purposes other than remedying health state, unspecified: Secondary | ICD-10-CM

## 2016-10-31 DIAGNOSIS — Z79899 Other long term (current) drug therapy: Secondary | ICD-10-CM | POA: Insufficient documentation

## 2016-10-31 DIAGNOSIS — R002 Palpitations: Secondary | ICD-10-CM | POA: Diagnosis not present

## 2016-10-31 DIAGNOSIS — Z87891 Personal history of nicotine dependence: Secondary | ICD-10-CM | POA: Diagnosis not present

## 2016-10-31 DIAGNOSIS — E785 Hyperlipidemia, unspecified: Secondary | ICD-10-CM | POA: Diagnosis not present

## 2016-10-31 HISTORY — DX: Gastro-esophageal reflux disease without esophagitis: K21.9

## 2016-10-31 HISTORY — DX: Bronchitis, not specified as acute or chronic: J40

## 2016-10-31 HISTORY — DX: Other intervertebral disc displacement, lumbar region: M51.26

## 2016-10-31 HISTORY — PX: LUMBAR LAMINECTOMY/DECOMPRESSION MICRODISCECTOMY: SHX5026

## 2016-10-31 HISTORY — DX: Headache, unspecified: R51.9

## 2016-10-31 HISTORY — DX: Unspecified asthma, uncomplicated: J45.909

## 2016-10-31 HISTORY — DX: Headache: R51

## 2016-10-31 HISTORY — DX: Unspecified osteoarthritis, unspecified site: M19.90

## 2016-10-31 LAB — CBC
HCT: 38.7 % (ref 36.0–46.0)
Hemoglobin: 12.4 g/dL (ref 12.0–15.0)
MCH: 29.2 pg (ref 26.0–34.0)
MCHC: 32 g/dL (ref 30.0–36.0)
MCV: 91.1 fL (ref 78.0–100.0)
PLATELETS: 259 10*3/uL (ref 150–400)
RBC: 4.25 MIL/uL (ref 3.87–5.11)
RDW: 13.3 % (ref 11.5–15.5)
WBC: 13.9 10*3/uL — ABNORMAL HIGH (ref 4.0–10.5)

## 2016-10-31 LAB — BASIC METABOLIC PANEL
ANION GAP: 6 (ref 5–15)
BUN: 17 mg/dL (ref 6–20)
CALCIUM: 9.1 mg/dL (ref 8.9–10.3)
CO2: 30 mmol/L (ref 22–32)
Chloride: 103 mmol/L (ref 101–111)
Creatinine, Ser: 0.47 mg/dL (ref 0.44–1.00)
Glucose, Bld: 116 mg/dL — ABNORMAL HIGH (ref 65–99)
POTASSIUM: 4.1 mmol/L (ref 3.5–5.1)
Sodium: 139 mmol/L (ref 135–145)

## 2016-10-31 SURGERY — LUMBAR LAMINECTOMY/DECOMPRESSION MICRODISCECTOMY 1 LEVEL
Anesthesia: General | Site: Spine Lumbar | Laterality: Right

## 2016-10-31 MED ORDER — MAGNESIUM HYDROXIDE 400 MG/5ML PO SUSP: 30.0000 mL | Freq: Every day | ORAL | Status: DC | PRN

## 2016-10-31 MED ORDER — HEMOSTATIC AGENTS (NO CHARGE) OPTIME
TOPICAL | Status: DC | PRN
  Administered 2016-10-31: 1 via TOPICAL

## 2016-10-31 MED ORDER — BUPIVACAINE HCL (PF) 0.5 % IJ SOLN
INTRAMUSCULAR | Status: AC
  Filled 2016-10-31: qty 30

## 2016-10-31 MED ORDER — HYDROMORPHONE HCL 1 MG/ML IJ SOLN
INTRAMUSCULAR | Status: AC
  Filled 2016-10-31: qty 1

## 2016-10-31 MED ORDER — ONDANSETRON HCL 4 MG/2ML IJ SOLN: 4.0000 mg | Freq: Four times a day (QID) | INTRAMUSCULAR | Status: DC | PRN

## 2016-10-31 MED ORDER — ROCURONIUM BROMIDE 10 MG/ML (PF) SYRINGE
PREFILLED_SYRINGE | INTRAVENOUS | Status: AC
  Filled 2016-10-31: qty 5

## 2016-10-31 MED ORDER — ONDANSETRON HCL 4 MG/2ML IJ SOLN
INTRAMUSCULAR | Status: DC | PRN
  Administered 2016-10-31: 4 mg via INTRAVENOUS

## 2016-10-31 MED ORDER — SODIUM CHLORIDE 0.9% FLUSH
3.0000 mL | Freq: Two times a day (BID) | INTRAVENOUS | Status: DC
  Administered 2016-10-31 (×2): 3 mL via INTRAVENOUS

## 2016-10-31 MED ORDER — ONDANSETRON HCL 4 MG PO TABS: 4.0000 mg | ORAL_TABLET | Freq: Four times a day (QID) | ORAL | Status: DC | PRN

## 2016-10-31 MED ORDER — MIDAZOLAM HCL 2 MG/2ML IJ SOLN
INTRAMUSCULAR | Status: AC
  Filled 2016-10-31: qty 2

## 2016-10-31 MED ORDER — BISACODYL 10 MG RE SUPP: 10.0000 mg | Freq: Every day | RECTAL | Status: DC | PRN

## 2016-10-31 MED ORDER — AMITRIPTYLINE HCL 50 MG PO TABS
50.0000 mg | ORAL_TABLET | Freq: Every day | ORAL | Status: DC
  Administered 2016-10-31: 50 mg via ORAL
  Filled 2016-10-31: qty 1

## 2016-10-31 MED ORDER — LACTATED RINGERS IV SOLN
INTRAVENOUS | Status: DC | PRN
  Administered 2016-10-31 (×2): via INTRAVENOUS

## 2016-10-31 MED ORDER — BUPIVACAINE HCL (PF) 0.5 % IJ SOLN
INTRAMUSCULAR | Status: DC | PRN
  Administered 2016-10-31: 10 mL

## 2016-10-31 MED ORDER — FENTANYL CITRATE (PF) 100 MCG/2ML IJ SOLN
INTRAMUSCULAR | Status: DC | PRN
  Administered 2016-10-31: 100 ug via INTRAVENOUS

## 2016-10-31 MED ORDER — PANTOPRAZOLE SODIUM 40 MG PO TBEC
40.0000 mg | DELAYED_RELEASE_TABLET | Freq: Every day | ORAL | Status: DC
  Administered 2016-11-01: 40 mg via ORAL
  Filled 2016-10-31: qty 1

## 2016-10-31 MED ORDER — PROMETHAZINE HCL 25 MG/ML IJ SOLN: 6.2500 mg | INTRAMUSCULAR | Status: DC | PRN

## 2016-10-31 MED ORDER — CYCLOBENZAPRINE HCL 5 MG PO TABS
10.0000 mg | ORAL_TABLET | Freq: Three times a day (TID) | ORAL | Status: DC | PRN
  Administered 2016-10-31: 10 mg via ORAL

## 2016-10-31 MED ORDER — CHLORHEXIDINE GLUCONATE CLOTH 2 % EX PADS: 6.0000 | MEDICATED_PAD | Freq: Once | CUTANEOUS | Status: DC

## 2016-10-31 MED ORDER — ACETAMINOPHEN 10 MG/ML IV SOLN
INTRAVENOUS | Status: DC | PRN
  Administered 2016-10-31: 1000 mg via INTRAVENOUS

## 2016-10-31 MED ORDER — SUCCINYLCHOLINE CHLORIDE 200 MG/10ML IV SOSY
PREFILLED_SYRINGE | INTRAVENOUS | Status: AC
Start: 2016-10-31 — End: 2016-10-31
  Filled 2016-10-31: qty 10

## 2016-10-31 MED ORDER — KCL IN DEXTROSE-NACL 20-5-0.45 MEQ/L-%-% IV SOLN: INTRAVENOUS | Status: DC

## 2016-10-31 MED ORDER — PHENYLEPHRINE 40 MCG/ML (10ML) SYRINGE FOR IV PUSH (FOR BLOOD PRESSURE SUPPORT)
PREFILLED_SYRINGE | INTRAVENOUS | Status: AC
  Filled 2016-10-31: qty 10

## 2016-10-31 MED ORDER — METHYLPREDNISOLONE ACETATE 80 MG/ML IJ SUSP
INTRAMUSCULAR | Status: DC | PRN
  Administered 2016-10-31: 80 mg

## 2016-10-31 MED ORDER — 0.9 % SODIUM CHLORIDE (POUR BTL) OPTIME
TOPICAL | Status: DC | PRN
  Administered 2016-10-31: 1000 mL

## 2016-10-31 MED ORDER — GELATIN ABSORBABLE MT POWD
OROMUCOSAL | Status: DC | PRN
  Administered 2016-10-31: 5 mL via TOPICAL

## 2016-10-31 MED ORDER — LIDOCAINE HCL (CARDIAC) 20 MG/ML IV SOLN
INTRAVENOUS | Status: DC | PRN
Start: 2016-10-31 — End: 2016-10-31
  Administered 2016-10-31: 50 mg via INTRAVENOUS

## 2016-10-31 MED ORDER — LIDOCAINE 2% (20 MG/ML) 5 ML SYRINGE
INTRAMUSCULAR | Status: AC
  Filled 2016-10-31: qty 5

## 2016-10-31 MED ORDER — KETOROLAC TROMETHAMINE 30 MG/ML IJ SOLN
30.0000 mg | Freq: Four times a day (QID) | INTRAMUSCULAR | Status: DC
  Administered 2016-10-31 – 2016-11-01 (×3): 30 mg via INTRAVENOUS
  Filled 2016-10-31 (×3): qty 1

## 2016-10-31 MED ORDER — METOPROLOL SUCCINATE ER 50 MG PO TB24
50.0000 mg | ORAL_TABLET | Freq: Every day | ORAL | Status: DC
  Filled 2016-10-31: qty 1

## 2016-10-31 MED ORDER — LACTATED RINGERS IV SOLN: INTRAVENOUS | Status: DC

## 2016-10-31 MED ORDER — MENTHOL 3 MG MT LOZG: 1.0000 | LOZENGE | OROMUCOSAL | Status: DC | PRN

## 2016-10-31 MED ORDER — CLONAZEPAM 0.5 MG PO TABS
0.5000 mg | ORAL_TABLET | Freq: Every day | ORAL | Status: DC
  Administered 2016-10-31: 0.5 mg via ORAL
  Filled 2016-10-31: qty 1

## 2016-10-31 MED ORDER — SIMVASTATIN 20 MG PO TABS
20.0000 mg | ORAL_TABLET | Freq: Every day | ORAL | Status: DC
  Administered 2016-10-31: 20 mg via ORAL
  Filled 2016-10-31: qty 1

## 2016-10-31 MED ORDER — SUGAMMADEX SODIUM 200 MG/2ML IV SOLN
INTRAVENOUS | Status: AC
  Filled 2016-10-31: qty 2

## 2016-10-31 MED ORDER — KETOROLAC TROMETHAMINE 30 MG/ML IJ SOLN
INTRAMUSCULAR | Status: AC
  Filled 2016-10-31: qty 1

## 2016-10-31 MED ORDER — METHYLPREDNISOLONE ACETATE 80 MG/ML IJ SUSP
INTRAMUSCULAR | Status: AC
  Filled 2016-10-31: qty 1

## 2016-10-31 MED ORDER — HYDROXYZINE HCL 25 MG PO TABS
50.0000 mg | ORAL_TABLET | ORAL | Status: DC | PRN
Start: 2016-10-31 — End: 2016-11-01
  Administered 2016-10-31: 50 mg via ORAL
  Filled 2016-10-31: qty 2

## 2016-10-31 MED ORDER — KETOROLAC TROMETHAMINE 30 MG/ML IJ SOLN
30.0000 mg | Freq: Once | INTRAMUSCULAR | Status: AC
  Administered 2016-10-31: 30 mg via INTRAVENOUS

## 2016-10-31 MED ORDER — FENTANYL CITRATE (PF) 100 MCG/2ML IJ SOLN
INTRAMUSCULAR | Status: AC
  Filled 2016-10-31: qty 2

## 2016-10-31 MED ORDER — SUGAMMADEX SODIUM 200 MG/2ML IV SOLN
INTRAVENOUS | Status: DC | PRN
  Administered 2016-10-31: 170 mg via INTRAVENOUS

## 2016-10-31 MED ORDER — ACETAMINOPHEN 10 MG/ML IV SOLN
INTRAVENOUS | Status: AC
  Filled 2016-10-31: qty 100

## 2016-10-31 MED ORDER — FLEET ENEMA 7-19 GM/118ML RE ENEM: 1.0000 | ENEMA | Freq: Once | RECTAL | Status: DC | PRN

## 2016-10-31 MED ORDER — MAGNESIUM OXIDE 400 (241.3 MG) MG PO TABS
400.0000 mg | ORAL_TABLET | Freq: Every day | ORAL | Status: DC
  Administered 2016-10-31: 400 mg via ORAL
  Filled 2016-10-31: qty 1

## 2016-10-31 MED ORDER — HYDROMORPHONE HCL 1 MG/ML IJ SOLN
0.2500 mg | INTRAMUSCULAR | Status: DC | PRN
  Administered 2016-10-31: 0.5 mg via INTRAVENOUS

## 2016-10-31 MED ORDER — PHENYLEPHRINE HCL 10 MG/ML IJ SOLN
INTRAVENOUS | Status: DC | PRN
  Administered 2016-10-31: 25 ug/min via INTRAVENOUS

## 2016-10-31 MED ORDER — HYDROXYZINE HCL 50 MG/ML IM SOLN: 50.0000 mg | INTRAMUSCULAR | Status: DC | PRN

## 2016-10-31 MED ORDER — EPHEDRINE 5 MG/ML INJ
INTRAVENOUS | Status: AC
  Filled 2016-10-31: qty 10

## 2016-10-31 MED ORDER — SODIUM CHLORIDE 0.9 % IR SOLN
Status: DC | PRN
  Administered 2016-10-31: 500 mL

## 2016-10-31 MED ORDER — CYCLOBENZAPRINE HCL 10 MG PO TABS
ORAL_TABLET | ORAL | Status: AC
  Filled 2016-10-31: qty 1

## 2016-10-31 MED ORDER — THROMBIN 5000 UNITS EX SOLR
CUTANEOUS | Status: DC | PRN
  Administered 2016-10-31 (×2): 5000 [IU] via TOPICAL

## 2016-10-31 MED ORDER — MEPERIDINE HCL 25 MG/ML IJ SOLN: 6.2500 mg | INTRAMUSCULAR | Status: DC | PRN

## 2016-10-31 MED ORDER — LIDOCAINE-EPINEPHRINE 1 %-1:100000 IJ SOLN
INTRAMUSCULAR | Status: DC | PRN
  Administered 2016-10-31: 10 mL

## 2016-10-31 MED ORDER — THROMBIN 5000 UNITS EX SOLR
CUTANEOUS | Status: AC
  Filled 2016-10-31: qty 15000

## 2016-10-31 MED ORDER — PROPOFOL 10 MG/ML IV BOLUS
INTRAVENOUS | Status: DC | PRN
  Administered 2016-10-31: 140 mg via INTRAVENOUS

## 2016-10-31 MED ORDER — ONDANSETRON HCL 4 MG/2ML IJ SOLN
INTRAMUSCULAR | Status: AC
  Filled 2016-10-31: qty 2

## 2016-10-31 MED ORDER — ACETAMINOPHEN 650 MG RE SUPP: 650.0000 mg | RECTAL | Status: DC | PRN

## 2016-10-31 MED ORDER — PHENYLEPHRINE 40 MCG/ML (10ML) SYRINGE FOR IV PUSH (FOR BLOOD PRESSURE SUPPORT)
PREFILLED_SYRINGE | INTRAVENOUS | Status: DC | PRN
  Administered 2016-10-31: 120 ug via INTRAVENOUS
  Administered 2016-10-31: 40 ug via INTRAVENOUS
  Administered 2016-10-31 (×2): 80 ug via INTRAVENOUS

## 2016-10-31 MED ORDER — GENTAMICIN SULFATE 40 MG/ML IJ SOLN
INTRAVENOUS | Status: DC | PRN
  Administered 2016-10-31: 80 mg via INTRAVENOUS

## 2016-10-31 MED ORDER — PHENOL 1.4 % MT LIQD: 1.0000 | OROMUCOSAL | Status: DC | PRN

## 2016-10-31 MED ORDER — ARTIFICIAL TEARS OPHTHALMIC OINT
TOPICAL_OINTMENT | OPHTHALMIC | Status: DC | PRN
  Administered 2016-10-31: 1 via OPHTHALMIC

## 2016-10-31 MED ORDER — ROCURONIUM BROMIDE 100 MG/10ML IV SOLN
INTRAVENOUS | Status: DC | PRN
  Administered 2016-10-31: 50 mg via INTRAVENOUS
  Administered 2016-10-31: 10 mg via INTRAVENOUS
  Administered 2016-10-31: 20 mg via INTRAVENOUS

## 2016-10-31 MED ORDER — MIDAZOLAM HCL 5 MG/5ML IJ SOLN
INTRAMUSCULAR | Status: DC | PRN
  Administered 2016-10-31: 2 mg via INTRAVENOUS

## 2016-10-31 MED ORDER — PROPOFOL 10 MG/ML IV BOLUS
INTRAVENOUS | Status: AC
  Filled 2016-10-31: qty 40

## 2016-10-31 MED ORDER — FENTANYL CITRATE (PF) 100 MCG/2ML IJ SOLN
INTRAMUSCULAR | Status: DC | PRN
  Administered 2016-10-31 (×3): 50 ug via INTRAVENOUS

## 2016-10-31 MED ORDER — ALUM & MAG HYDROXIDE-SIMETH 200-200-20 MG/5ML PO SUSP: 30.0000 mL | Freq: Four times a day (QID) | ORAL | Status: DC | PRN

## 2016-10-31 MED ORDER — LIDOCAINE-EPINEPHRINE 1 %-1:100000 IJ SOLN
INTRAMUSCULAR | Status: AC
  Filled 2016-10-31: qty 1

## 2016-10-31 MED ORDER — GENTAMICIN IN SALINE 1.6-0.9 MG/ML-% IV SOLN
INTRAVENOUS | Status: AC
  Filled 2016-10-31: qty 50

## 2016-10-31 MED ORDER — FENTANYL CITRATE (PF) 250 MCG/5ML IJ SOLN
INTRAMUSCULAR | Status: AC
  Filled 2016-10-31: qty 5

## 2016-10-31 MED ORDER — SODIUM CHLORIDE 0.9% FLUSH: 3.0000 mL | INTRAVENOUS | Status: DC | PRN

## 2016-10-31 MED ORDER — OXYBUTYNIN CHLORIDE ER 15 MG PO TB24
15.0000 mg | ORAL_TABLET | Freq: Every day | ORAL | Status: DC
  Administered 2016-10-31 – 2016-11-01 (×2): 15 mg via ORAL
  Filled 2016-10-31 (×2): qty 1

## 2016-10-31 MED ORDER — MORPHINE SULFATE (PF) 4 MG/ML IV SOLN: 4.0000 mg | INTRAVENOUS | Status: DC | PRN

## 2016-10-31 MED ORDER — HYDROCODONE-ACETAMINOPHEN 5-325 MG PO TABS
1.0000 | ORAL_TABLET | ORAL | Status: DC | PRN
  Administered 2016-10-31: 2 via ORAL
  Administered 2016-10-31: 1 via ORAL
  Administered 2016-11-01: 2 via ORAL
  Administered 2016-11-01: 1 via ORAL
  Filled 2016-10-31: qty 2
  Filled 2016-10-31: qty 1
  Filled 2016-10-31: qty 2
  Filled 2016-10-31: qty 1

## 2016-10-31 MED ORDER — ACETAMINOPHEN 325 MG PO TABS: 650.0000 mg | ORAL_TABLET | ORAL | Status: DC | PRN

## 2016-10-31 MED ORDER — VANCOMYCIN HCL IN DEXTROSE 1-5 GM/200ML-% IV SOLN
1000.0000 mg | INTRAVENOUS | Status: AC
  Administered 2016-10-31: 1000 mg via INTRAVENOUS
  Filled 2016-10-31: qty 200

## 2016-10-31 SURGICAL SUPPLY — 63 items
ADH SKN CLS APL DERMABOND .7 (GAUZE/BANDAGES/DRESSINGS) ×1
APL SKNCLS STERI-STRIP NONHPOA (GAUZE/BANDAGES/DRESSINGS)
BAG DECANTER FOR FLEXI CONT (MISCELLANEOUS) ×2 IMPLANT
BENZOIN TINCTURE PRP APPL 2/3 (GAUZE/BANDAGES/DRESSINGS) IMPLANT
BLADE CLIPPER SURG (BLADE) IMPLANT
BUR ACORN 6.0 ACORN (BURR) IMPLANT
BUR ACRON 5.0MM COATED (BURR) ×2 IMPLANT
BUR MATCHSTICK NEURO 3.0 LAGG (BURR) ×2 IMPLANT
CANISTER SUCT 3000ML PPV (MISCELLANEOUS) ×2 IMPLANT
CARTRIDGE OIL MAESTRO DRILL (MISCELLANEOUS) ×1 IMPLANT
DERMABOND ADVANCED (GAUZE/BANDAGES/DRESSINGS) ×1
DERMABOND ADVANCED .7 DNX12 (GAUZE/BANDAGES/DRESSINGS) IMPLANT
DIFFUSER DRILL AIR PNEUMATIC (MISCELLANEOUS) ×2 IMPLANT
DRAPE LAPAROTOMY 100X72X124 (DRAPES) ×2 IMPLANT
DRAPE MICROSCOPE LEICA (MISCELLANEOUS) ×2 IMPLANT
DRAPE POUCH INSTRU U-SHP 10X18 (DRAPES) ×2 IMPLANT
ELECT REM PT RETURN 9FT ADLT (ELECTROSURGICAL) ×2
ELECTRODE REM PT RTRN 9FT ADLT (ELECTROSURGICAL) ×1 IMPLANT
GAUZE SPONGE 4X4 12PLY STRL (GAUZE/BANDAGES/DRESSINGS) IMPLANT
GAUZE SPONGE 4X4 16PLY XRAY LF (GAUZE/BANDAGES/DRESSINGS) ×1 IMPLANT
GLOVE BIO SURGEON STRL SZ8 (GLOVE) ×1 IMPLANT
GLOVE BIOGEL PI IND STRL 7.0 (GLOVE) ×1 IMPLANT
GLOVE BIOGEL PI IND STRL 8 (GLOVE) ×1 IMPLANT
GLOVE BIOGEL PI IND STRL 8.5 (GLOVE) IMPLANT
GLOVE BIOGEL PI INDICATOR 7.0 (GLOVE) ×1
GLOVE BIOGEL PI INDICATOR 8 (GLOVE) ×3
GLOVE BIOGEL PI INDICATOR 8.5 (GLOVE) ×1
GLOVE ECLIPSE 7.5 STRL STRAW (GLOVE) ×6 IMPLANT
GLOVE EXAM NITRILE LRG STRL (GLOVE) IMPLANT
GLOVE EXAM NITRILE XL STR (GLOVE) IMPLANT
GLOVE EXAM NITRILE XS STR PU (GLOVE) IMPLANT
GOWN STRL REUS W/ TWL LRG LVL3 (GOWN DISPOSABLE) ×1 IMPLANT
GOWN STRL REUS W/ TWL XL LVL3 (GOWN DISPOSABLE) IMPLANT
GOWN STRL REUS W/TWL 2XL LVL3 (GOWN DISPOSABLE) ×1 IMPLANT
GOWN STRL REUS W/TWL LRG LVL3 (GOWN DISPOSABLE)
GOWN STRL REUS W/TWL XL LVL3 (GOWN DISPOSABLE) ×4
HEMOSTAT POWDER SURGIFOAM 1G (HEMOSTASIS) ×1 IMPLANT
KIT BASIN OR (CUSTOM PROCEDURE TRAY) ×2 IMPLANT
KIT ROOM TURNOVER OR (KITS) ×2 IMPLANT
NDL HYPO 18GX1.5 BLUNT FILL (NEEDLE) IMPLANT
NDL SPNL 18GX3.5 QUINCKE PK (NEEDLE) ×1 IMPLANT
NDL SPNL 22GX3.5 QUINCKE BK (NEEDLE) ×1 IMPLANT
NEEDLE HYPO 18GX1.5 BLUNT FILL (NEEDLE) ×2 IMPLANT
NEEDLE SPNL 18GX3.5 QUINCKE PK (NEEDLE) ×2 IMPLANT
NEEDLE SPNL 22GX3.5 QUINCKE BK (NEEDLE) ×2 IMPLANT
NS IRRIG 1000ML POUR BTL (IV SOLUTION) ×2 IMPLANT
OIL CARTRIDGE MAESTRO DRILL (MISCELLANEOUS) ×2
PACK LAMINECTOMY NEURO (CUSTOM PROCEDURE TRAY) ×2 IMPLANT
PAD ARMBOARD 7.5X6 YLW CONV (MISCELLANEOUS) ×8 IMPLANT
PATTIES SURGICAL .5 X1 (DISPOSABLE) IMPLANT
RUBBERBAND STERILE (MISCELLANEOUS) ×4 IMPLANT
SPONGE LAP 4X18 X RAY DECT (DISPOSABLE) IMPLANT
SPONGE SURGIFOAM ABS GEL SZ50 (HEMOSTASIS) ×2 IMPLANT
STRIP CLOSURE SKIN 1/2X4 (GAUZE/BANDAGES/DRESSINGS) IMPLANT
SUT PROLENE 6 0 BV (SUTURE) IMPLANT
SUT VIC AB 1 CT1 18XBRD ANBCTR (SUTURE) ×1 IMPLANT
SUT VIC AB 1 CT1 8-18 (SUTURE) ×4
SUT VIC AB 2-0 CP2 18 (SUTURE) ×3 IMPLANT
SUT VIC AB 3-0 SH 8-18 (SUTURE) ×1 IMPLANT
SYR 5ML LL (SYRINGE) ×1 IMPLANT
TOWEL GREEN STERILE (TOWEL DISPOSABLE) ×2 IMPLANT
TOWEL GREEN STERILE FF (TOWEL DISPOSABLE) ×2 IMPLANT
WATER STERILE IRR 1000ML POUR (IV SOLUTION) ×2 IMPLANT

## 2016-10-31 NOTE — Anesthesia Procedure Notes (Signed)
Procedure Name: Intubation Date/Time: 10/31/2016 7:39 AM Performed by: Willeen Cass P Pre-anesthesia Checklist: Patient identified, Emergency Drugs available, Suction available and Patient being monitored Patient Re-evaluated:Patient Re-evaluated prior to inductionOxygen Delivery Method: Circle System Utilized Preoxygenation: Pre-oxygenation with 100% oxygen Intubation Type: IV induction Ventilation: Mask ventilation without difficulty Laryngoscope Size: Mac and 3 Grade View: Grade I Tube type: Oral Tube size: 7.0 mm Number of attempts: 1 Airway Equipment and Method: Stylet and Oral airway Placement Confirmation: ETT inserted through vocal cords under direct vision,  positive ETCO2 and breath sounds checked- equal and bilateral Secured at: 22 cm Tube secured with: Tape Dental Injury: Teeth and Oropharynx as per pre-operative assessment

## 2016-10-31 NOTE — Anesthesia Postprocedure Evaluation (Addendum)
Anesthesia Post Note  Patient: Kaitlyn Santana  Procedure(s) Performed: Procedure(s) (LRB): Right Lumbar Five-Sacral One Lumbar laminotomy and microdiscectomy (Right)  Patient location during evaluation: PACU Anesthesia Type: General Level of consciousness: awake and alert Pain management: pain level controlled Vital Signs Assessment: post-procedure vital signs reviewed and stable Respiratory status: spontaneous breathing, nonlabored ventilation, respiratory function stable and patient connected to nasal cannula oxygen Cardiovascular status: blood pressure returned to baseline and stable Postop Assessment: no signs of nausea or vomiting Anesthetic complications: no       Last Vitals:  Vitals:   10/31/16 1100 10/31/16 1130  BP:  101/65  Pulse: 64 65  Resp: 14 16  Temp: 36.8 C 36.9 C    Last Pain:  Vitals:   10/31/16 1100  TempSrc:   PainSc: [place]

## 2016-10-31 NOTE — Transfer of Care (Signed)
Immediate Anesthesia Transfer of Care Note  Patient: Kaitlyn Santana  Procedure(s) Performed: Procedure(s) with comments: Right Lumbar Five-Sacral One Lumbar laminotomy and microdiscectomy (Right) - Right  Patient Location: PACU  Anesthesia Type:General  Level of Consciousness: awake and patient cooperative  Airway & Oxygen Therapy: Patient Spontanous Breathing and Patient connected to nasal cannula oxygen  Post-op Assessment: Report given to RN, Post -op Vital signs reviewed and stable and Patient moving all extremities X 4  Post vital signs: Reviewed and stable  Last Vitals:  Vitals:   10/31/16 0620  BP: 122/75  Pulse: 70  Resp: 18  Temp: 36.7 C    Last Pain:  Vitals:   10/31/16 0702  TempSrc:   PainSc: 5       Patients Stated Pain Goal: 3 (93/71/69 6789)  Complications: No apparent anesthesia complications

## 2016-10-31 NOTE — Progress Notes (Signed)
Vitals:   10/31/16 1027 10/31/16 1045 10/31/16 1100 10/31/16 1130  BP: 115/76   101/65  Pulse: 71 71 64 65  Resp: 17 13 14 16   Temp:   98.2 F (36.8 C) 98.4 F (36.9 C)  TempSrc:      SpO2: 97% 97% 98% 93%  Weight:      Height:        CBC  Recent Labs  10/31/16 0643  WBC 13.9*  HGB 12.4  HCT 38.7  PLT 259   BMET  Recent Labs  10/31/16 0643  NA 139  K 4.1  CL 103  CO2 30  GLUCOSE 116*  BUN 17  CREATININE 0.47  CALCIUM 9.1    Patient resting in bed, has voided. Has not yet ambulated. Wound clean and dry, no erythema, swelling or drainage. Patient notes that the right lower extremity feels much better.  Plan: Doing well following surgery. Encouraged to ambulate. Continue to progress through postoperative recovery.  Hosie Spangle, MD 10/31/2016, 2:20 PM

## 2016-10-31 NOTE — H&P (Signed)
Subjective: Patient is a 61 y.o. left-handed white female who is admitted for treatment of right L5-S1 lateral recess and neural foraminal stenosis secondary to spondylitic disc herniation and spondylitic overgrowth. Patient's had persistent and disabling right lumbar radicular pain numbness and tingling. Spinal injections of only given her transient relief. She is found and increasingly disabling at home and at work, is admitted now for a right L5-S1 lumbar laminotomy, foraminotomy, and microdiscectomy.    Patient Active Problem List   Diagnosis Date Noted  . Depression, major, recurrent ([place]) 02/17/2013  . IBS (irritable bowel syndrome) 03/10/2012  . Diarrhea 03/10/2012  . Abnormal echocardiogram 10/30/2010  . Palpitations   . HTN (hypertension)    Past Medical History:  Diagnosis Date  . Anxiety   . Arthritis   . Asthma    as a child only  . Bronchitis   . Cancer of left breast ([place]) 08/2003   under care of Dr. Sonny Dandy  . Depression   . GERD (gastroesophageal reflux disease)   . Headache   . HTN (hypertension)   . Hyperlipidemia   . Irritable bowel disease   . Lumbar herniated disc   . Other dyspnea and respiratory abnormality   . Palpitations     Past Surgical History:  Procedure Laterality Date  . BREAST SURGERY     CANCER DIAG 08-2003  . TOE SURGERY    . VAGINAL HYSTERECTOMY      Prescriptions Prior to Admission  Medication Sig Dispense Refill Last Dose  . amitriptyline (ELAVIL) 50 MG tablet Take 2 tablets (100 mg total) by mouth at bedtime. (Patient taking differently: Take 50 mg by mouth at bedtime. ) 60 tablet 3 10/30/2016 at Unknown time  . Calcium Carbonate-Vitamin D (CALCIUM 600+D PO) Take 2 tablets by mouth at bedtime.   10/30/2016 at Unknown time  . clonazePAM (KLONOPIN) 0.5 MG tablet Take 1 tablet (0.5 mg total) by mouth 2 (two) times daily. (Patient taking differently: Take 0.5 mg by mouth at bedtime. ) 60 tablet 3 10/30/2016 at Unknown time  . diclofenac  (VOLTAREN) 75 MG EC tablet Take 75 mg by mouth daily as needed for moderate pain.    Past Month at Unknown time  . magnesium oxide (MAG-OX) 400 MG tablet Take 400 mg by mouth at bedtime.   10/30/2016 at Unknown time  . metoprolol (TOPROL-XL) 50 MG 24 hr tablet Take 50 mg by mouth daily.     10/31/2016 at 0400  . Multiple Vitamin (MULTIVITAMIN WITH MINERALS) TABS tablet Take 1 tablet by mouth daily.   10/30/2016 at Unknown time  . oxybutynin (DITROPAN XL) 15 MG 24 hr tablet Take 15 mg by mouth daily.    10/30/2016 at Unknown time  . pantoprazole (PROTONIX) 40 MG tablet Take 40 mg by mouth daily.    10/31/2016 at 0400  . simvastatin (ZOCOR) 20 MG tablet Take 20 mg by mouth daily.    10/30/2016 at Unknown time  . naproxen sodium (ANAPROX) 220 MG tablet Take 440 mg by mouth daily as needed (pain).   More than a month at Unknown time   Allergies  Allergen Reactions  . Ancef [Cefazolin Sodium] Hives and Itching    Social History  Substance Use Topics  . Smoking status: Former Smoker    Packs/day: 1.00    Years: 35.00    Types: Cigarettes    Quit date: 05/10/2008  . Smokeless tobacco: Never Used     Comment: Quit in 2009  . Alcohol use Yes  Comment: occasional    Family History  Problem Relation Age of Onset  . Anxiety disorder Mother   . Depression Brother   . Stroke Maternal Grandfather      Review of Systems A comprehensive review of systems was negative.  Objective: Vital signs in last 24 hours: Temp:  [98 F (36.7 C)] 98 F (36.7 C) (05/24 0620) Pulse Rate:  [70] 70 (05/24 0620) Resp:  [18] 18 (05/24 0620) BP: (122)/(75) 122/75 (05/24 0620) SpO2:  [98 %] 98 % (05/24 0620) Weight:  [81.2 kg (179 lb)] 81.2 kg (179 lb) (05/24 0620)  EXAM: Patient well-developed well-nourished white female in no acute distress. Lungs are clear to auscultation , the patient has symmetrical respiratory excursion. Heart has a regular rate and rhythm normal S1 and S2 no murmur.   Abdomen is soft  nontender nondistended bowel sounds are present. Extremity examination shows no clubbing cyanosis or edema. Motor examination shows 5 over 5 strength in the lower extremities including the iliopsoas quadriceps dorsiflexor extensor hallicus  longus and plantar flexor bilaterally. Sensation is intact to pinprick in the distal lower extremities. Reflexes are symmetrical bilaterally. No pathologic reflexes are present. Patient has a normal gait and stance.   Data Review:CBC    Component Value Date/Time   WBC 13.9 (H) 10/31/2016 0643   RBC 4.25 10/31/2016 0643   HGB 12.4 10/31/2016 0643   HCT 38.7 10/31/2016 0643   PLT 259 10/31/2016 0643   MCV 91.1 10/31/2016 0643   MCH 29.2 10/31/2016 0643   MCHC 32.0 10/31/2016 0643   RDW 13.3 10/31/2016 0643   LYMPHSABS 4.2 (H) 09/20/2010 0925   MONOABS 1.5 (H) 09/20/2010 0925   EOSABS 0.2 09/20/2010 0925   BASOSABS 0.0 09/20/2010 0925                          BMET    Component Value Date/Time   NA 139 10/31/2016 0643   K 4.1 10/31/2016 0643   CL 103 10/31/2016 0643   CO2 30 10/31/2016 0643   GLUCOSE 116 (H) 10/31/2016 0643   BUN 17 10/31/2016 0643   CREATININE 0.47 10/31/2016 0643   CALCIUM 9.1 10/31/2016 0643   GFRNONAA >60 10/31/2016 0643   GFRAA >60 10/31/2016 7619     Assessment/Plan: Patient with right lumbar radicular pain, numbness, and tingling secondary to spondylitic disc herniation at L5-S1 extending across midline but laterally into the right L5-S1 neural foramen, with associated overlying facet arthropathy. There is overall marked canal stenosis, but severe right L5-S1 lateral recess stenosis and right L5-S1 neural foraminal stenosis. He is admitted now for right L5-S1 lumbar laminotomy, foraminotomy and microscopic discectomy.  I've discussed with the patient the nature of his condition, the nature the surgical procedure, the typical length of surgery, hospital stay, and overall recuperation. We discussed limitations  postoperatively. I discussed risks of surgery including risks of infection, bleeding, possibly need for transfusion, the risk of nerve root dysfunction with pain, weakness, numbness, or paresthesias, or risk of dural tear and CSF leakage and possible need for further surgery, the risk of recurrent disc herniation and the possible need for further surgery, and the risk of anesthetic complications including myocardial infarction, stroke, pneumonia, and death. Understanding all this the patient does wish to proceed with surgery and is admitted for such.    Hosie Spangle, MD 10/31/2016 7:22 AM

## 2016-10-31 NOTE — Op Note (Signed)
10/31/2016  9:53 AM  PATIENT:  Kaitlyn Santana  61 y.o. female  PRE-OPERATIVE DIAGNOSIS:  Right L5-S1 lumbar disc herniation, lumbar stenosis, lumbar spondylosis, lumbar degenerative disease, lumbar radiculopathy   POST-OPERATIVE DIAGNOSIS:  Right L5-S1 lumbar disc herniation, lumbar stenosis, lumbar spondylosis, lumbar degenerative disease, lumbar radiculopathy   PROCEDURE:  Procedure(s):  Right Lumbar Five-Sacral One Lumbar laminotomy, medial facetectomy, and microdiscectomy, with microdissection, microsurgical technique, and the operating microscope  SURGEON:  Surgeon(s): Jovita Gamma, MD Erline Levine, MD  ASSISTANTS: Erline Levine, M.D.  ANESTHESIA:   general  EBL:  Total I/O In: 1500 [I.V.:1500] Out: 200 [Blood:200]  BLOOD ADMINISTERED:none  COUNT: Correct per nursing staff  DICTATION: Patient was brought to the operating room and placed under general endotracheal anesthesia. Patient was turned to prone position the lumbar region was prepped with Betadine soap and solution and draped in a sterile fashion. The midline was infiltrated with local anesthetic with epinephrine. A localizing x-ray was taken and the L5-S1 level was identified. Midline incision was made over the L5-S1 level and was carried down through the subcutaneous tissue to the lumbar fascia. The lumbar fascia was incised on the right side and the paraspinal muscles were dissected from the spinous processes and lamina in a subperiosteal fashion. Another x-ray was taken and the L5-S1 intralaminar space was identified. The operating microscope was draped and brought into the field provided additional magnification, illumination, and visualization. Laminotomy was performed using the high-speed drill and Kerrison punches. The ligamentum flavum was carefully resected. The underlying thecal sac and nerve root were identified. A medial facetectomy was performed to decompress the canal and lateral recess stenosis. The disc  herniation was identified and the thecal sac and nerve root gently retracted medially. The annulus was incised, and the subligamentous disc herniation removed in a piecemeal fashion. Spondylitic overgrowth was removed using osteophyte removal tool and Kerrison punches. Intradiscal discectomy was performed using a variety of micro-curettes and pituitary rongeurs. Decompression was extended medially towards the midline, and laterally into the right L5-S1 neural foramen, decompressing the central canal stenosis as well as the right L5-S1 neural foraminal stenosis. In the end all loose fragments of disc material from both the disc space and epidural space were removed, spondylitic overgrowth was removed, and foraminotomies performed for the exiting right L5 and right S1 nerve roots. In good decompression of the thecal sac and exiting nerve roots was achieved. Once the discectomy was completed and good decompression of the thecal sac and nerve had been achieved hemostasis was established with the use of bipolar cautery and Gelfoam with thrombin. The Gelfoam was removed, a thin layer Surgifoam applied, and hemostasis confirmed. We then instilled 2 cc of fentanyl and 80 mg of Depo-Medrol into the epidural space. Deep fascia was closed with interrupted undyed 1 Vicryl sutures. Scarpa's fascia was closed with interrupted undyed 1 Vicryl sutures in the subcutaneous and subcuticular layer were closed with interrupted inverted 2-0 undyed Vicryl sutures. The skin edges were approximated with Dermabond. Following surgery the patient was turned back to a supine position to be reversed from the anesthetic extubated and transferred to the recovery room for further care.   PLAN OF CARE: Admit for overnight observation  PATIENT DISPOSITION:  PACU - hemodynamically stable.   Delay start of Pharmacological VTE agent (>24hrs) due to surgical blood loss or risk of bleeding:  yes

## 2016-11-01 ENCOUNTER — Encounter (HOSPITAL_COMMUNITY): Payer: Self-pay | Admitting: Neurosurgery

## 2016-11-01 DIAGNOSIS — M5116 Intervertebral disc disorders with radiculopathy, lumbar region: Secondary | ICD-10-CM | POA: Diagnosis not present

## 2016-11-01 MED ORDER — HYDROCODONE-ACETAMINOPHEN 5-325 MG PO TABS: 1.0000 | ORAL_TABLET | ORAL | 0 refills | Status: DC | PRN

## 2016-11-01 NOTE — Discharge Instructions (Signed)

## 2016-11-01 NOTE — Discharge Summary (Signed)
Physician Discharge Summary  Patient ID: Kaitlyn Santana MRN: [id number] DOB/AGE: 1955/09/01 61 y.o.  Admit date: 10/31/2016 Discharge date: 11/01/2016  Admission Diagnoses:  Right L5-S1 lumbar disc herniation, lumbar stenosis, lumbar spondylosis, lumbar degenerative disease, lumbar radiculopathy   Discharge Diagnoses:  Right L5-S1 lumbar disc herniation, lumbar stenosis, lumbar spondylosis, lumbar degenerative disease, lumbar radiculopathy  Active Problems:   HNP (herniated nucleus pulposus), lumbar   Discharged Condition: good  Hospital Course: Patient was admitted, underwent a right L5-S1 lumbar laminotomy and microdiscectomy. Postoperatively she is done well. She's had excellent relief of her radicular pain. She is up and ambulate actively in the halls. Her incision is healing nicely. She is asking to be discharged to home. She has been given instructions regarding wound care and activities following discharge. She is scheduled to follow-up with me in the office in 3 weeks.  Discharge Exam: Blood pressure (!) 94/58, pulse 83, temperature 98.5 F (36.9 C), temperature source Oral, resp. rate 18, height 5' 6.5" (1.689 m), weight 81.2 kg (179 lb), SpO2 95 %.  Disposition: 01-Home or Self Care  Discharge Instructions    Discharge wound care:    Complete by:  As directed    Leave the wound open to air. Shower daily with the wound uncovered. Water and soapy water should run over the incision area. Do not wash directly on the incision for 2 weeks. Remove the glue after 2 weeks.   Driving Restrictions    Complete by:  As directed    No driving for 2 weeks. May ride in the car locally now. May begin to drive locally in 2 weeks.   Other Restrictions    Complete by:  As directed    Walk gradually increasing distances out in the fresh air at least twice a day. Walking additional 6 times inside the house, gradually increasing distances, daily. No bending, lifting, or twisting. Perform activities  between shoulder and waist height (that is at counter height when standing or table height when sitting).     Allergies as of 11/01/2016      Reactions   Ancef [cefazolin Sodium] Hives, Itching      Medication List    TAKE these medications   amitriptyline 50 MG tablet Commonly known as:  ELAVIL Take 2 tablets (100 mg total) by mouth at bedtime. What changed:  how much to take   CALCIUM 600+D PO Take 2 tablets by mouth at bedtime.   clonazePAM 0.5 MG tablet Commonly known as:  KLONOPIN Take 1 tablet (0.5 mg total) by mouth 2 (two) times daily. What changed:  when to take this   diclofenac 75 MG EC tablet Commonly known as:  VOLTAREN Take 75 mg by mouth daily as needed for moderate pain.   HYDROcodone-acetaminophen 5-325 MG tablet Commonly known as:  NORCO/VICODIN Take 1-2 tablets by mouth every 4 (four) hours as needed (pain).   magnesium oxide 400 MG tablet Commonly known as:  MAG-OX Take 400 mg by mouth at bedtime.   metoprolol succinate 50 MG 24 hr tablet Commonly known as:  TOPROL-XL Take 50 mg by mouth daily.   multivitamin with minerals Tabs tablet Take 1 tablet by mouth daily.   naproxen sodium 220 MG tablet Commonly known as:  ANAPROX Take 440 mg by mouth daily as needed (pain).   oxybutynin 15 MG 24 hr tablet Commonly known as:  DITROPAN XL Take 15 mg by mouth daily.   pantoprazole 40 MG tablet Commonly known as:  PROTONIX Take  40 mg by mouth daily.   simvastatin 20 MG tablet Commonly known as:  ZOCOR Take 20 mg by mouth daily.        SignedHosie Spangle 11/01/2016, 10:35 AM

## 2016-11-01 NOTE — Progress Notes (Signed)
Patient alert and oriented, mae's well, voiding adequate amount of urine, swallowing without difficulty, no c/o pain at time of discharge. Patient discharged home with family. Script and discharged instructions given to patient. Patient and family stated understanding of instructions given. Patient has an appointment with Dr. Nudelman 

## 2016-11-08 NOTE — Addendum Note (Signed)
Addendum  created 11/08/16 1312 by Effie Berkshire, MD   Sign clinical note

## 2016-11-14 ENCOUNTER — Other Ambulatory Visit (HOSPITAL_COMMUNITY): Payer: Self-pay | Admitting: Psychiatry

## 2016-12-13 ENCOUNTER — Other Ambulatory Visit (HOSPITAL_COMMUNITY): Payer: Self-pay | Admitting: Psychiatry

## 2016-12-23 ENCOUNTER — Ambulatory Visit: Payer: 59 | Admitting: Neurology

## 2017-02-02 ENCOUNTER — Other Ambulatory Visit: Payer: Self-pay | Admitting: Neurology

## 2017-12-09 ENCOUNTER — Emergency Department (HOSPITAL_COMMUNITY): Payer: No Typology Code available for payment source

## 2017-12-09 ENCOUNTER — Encounter (HOSPITAL_COMMUNITY): Payer: Self-pay | Admitting: Emergency Medicine

## 2017-12-09 ENCOUNTER — Emergency Department (HOSPITAL_COMMUNITY)
Admission: EM | Admit: 2017-12-09 | Discharge: 2017-12-09 | Disposition: A | Discharge status: Home / Self Care | Payer: No Typology Code available for payment source | Attending: Emergency Medicine | Admitting: Emergency Medicine

## 2017-12-09 DIAGNOSIS — D0592 Unspecified type of carcinoma in situ of left breast: Secondary | ICD-10-CM | POA: Diagnosis not present

## 2017-12-09 DIAGNOSIS — W19XXXA Unspecified fall, initial encounter: Secondary | ICD-10-CM

## 2017-12-09 DIAGNOSIS — Y999 Unspecified external cause status: Secondary | ICD-10-CM | POA: Diagnosis not present

## 2017-12-09 DIAGNOSIS — W101XXA Fall (on)(from) sidewalk curb, initial encounter: Secondary | ICD-10-CM | POA: Diagnosis not present

## 2017-12-09 DIAGNOSIS — J45909 Unspecified asthma, uncomplicated: Secondary | ICD-10-CM | POA: Insufficient documentation

## 2017-12-09 DIAGNOSIS — I10 Essential (primary) hypertension: Secondary | ICD-10-CM | POA: Insufficient documentation

## 2017-12-09 DIAGNOSIS — Y9301 Activity, walking, marching and hiking: Secondary | ICD-10-CM | POA: Insufficient documentation

## 2017-12-09 DIAGNOSIS — Z79899 Other long term (current) drug therapy: Secondary | ICD-10-CM | POA: Diagnosis not present

## 2017-12-09 DIAGNOSIS — S2241XA Multiple fractures of ribs, right side, initial encounter for closed fracture: Secondary | ICD-10-CM | POA: Diagnosis not present

## 2017-12-09 DIAGNOSIS — Y929 Unspecified place or not applicable: Secondary | ICD-10-CM | POA: Diagnosis not present

## 2017-12-09 DIAGNOSIS — W0110XA Fall on same level from slipping, tripping and stumbling with subsequent striking against unspecified object, initial encounter: Secondary | ICD-10-CM | POA: Insufficient documentation

## 2017-12-09 DIAGNOSIS — S299XXA Unspecified injury of thorax, initial encounter: Secondary | ICD-10-CM | POA: Diagnosis present

## 2017-12-09 DIAGNOSIS — Z87891 Personal history of nicotine dependence: Secondary | ICD-10-CM | POA: Insufficient documentation

## 2017-12-09 MED ORDER — ACETAMINOPHEN 500 MG PO TABS
1000.0000 mg | ORAL_TABLET | Freq: Once | ORAL | Status: AC
  Administered 2017-12-09: 1000 mg via ORAL
  Filled 2017-12-09: qty 2

## 2017-12-09 MED ORDER — IBUPROFEN 400 MG PO TABS
600.0000 mg | ORAL_TABLET | Freq: Once | ORAL | Status: AC
  Administered 2017-12-09: 600 mg via ORAL
  Filled 2017-12-09: qty 1

## 2017-12-09 MED ORDER — LIDOCAINE 5 % EX PTCH: 1.0000 | MEDICATED_PATCH | CUTANEOUS | 0 refills | Status: DC

## 2017-12-09 MED ORDER — HYDROCODONE-ACETAMINOPHEN 5-325 MG PO TABS: 1.0000 | ORAL_TABLET | Freq: Four times a day (QID) | ORAL | 0 refills | Status: AC | PRN

## 2017-12-09 NOTE — ED Notes (Signed)
Incentive spirometer provided to patient.

## 2017-12-09 NOTE — Discharge Instructions (Addendum)
Please take 1-2 Hydrocodone-acetaminophen 5-325 mg tablets as need for moderate-severe pain. Also take 2 ibuprofen 200 mg as needed for pain. You can also use a Lidoderm patch for your shoulder and chest pain. Please use 1 patch every 24 hours as need.  Please see attached document for incentive spirometer instructions. Please continue using your incentive spirometer until you follow-up with orthopedic surgeon.   Please also follow-up with Dr. Annie Main Case (orthopedic surgeon) in 1-2 weeks if shoulder or chest pain worsens.

## 2017-12-09 NOTE — ED Triage Notes (Signed)
Pt arrives via EMS from work with reports of a fall where she landed on her chest. Pt denies LOC, head, neck or back pain. Endorses right arm/ shoulder and breast pain.

## 2017-12-09 NOTE — Progress Notes (Signed)
Orthopedic Tech Progress Note Patient Details:  Kaitlyn Santana 10-Dec-1955 [id number]  Ortho Devices Type of Ortho Device: Arm sling Ortho Device/Splint Location: rue Ortho Device/Splint Interventions: Application   Post Interventions Patient Tolerated: Well Instructions Provided: Care of device   Hildred Priest 12/09/2017, 11:15 AM

## 2017-12-09 NOTE — ED Provider Notes (Signed)
[facility] EMERGENCY DEPARTMENT Provider Note   CSN: [id number] Arrival date & time: 12/09/17  0932     History   Chief Complaint Chief Complaint  Patient presents with  . Fall    HPI Kaitlyn Santana is a 62 y.o. female.female with a past medical history of HTN and arthritis who presents after a fall at work. Patient states that she tripped over a step in the floor 2 hours prior to presentation. She states that she fell on her chest and right arm. She reports right chest pain, right arm pain, and right knee pain worse with movement. Chest pain is also worsened with deep breathing. She denies LOC and hitting her head. She also denies head, neck and back pain. She is not currently taking anti-coagulation medicine.    Past Medical History:  Diagnosis Date  . Anxiety   . Arthritis   . Asthma    as a child only  . Bronchitis   . Cancer of left breast ([place]) 08/2003   under care of Dr. Sonny Dandy  . Depression   . GERD (gastroesophageal reflux disease)   . Headache   . HTN (hypertension)   . Hyperlipidemia   . Irritable bowel disease   . Lumbar herniated disc   . Other dyspnea and respiratory abnormality   . Palpitations     Patient Active Problem List   Diagnosis Date Noted  . HNP (herniated nucleus pulposus), lumbar 10/31/2016  . Depression, major, recurrent ([place]) 02/17/2013  . IBS (irritable bowel syndrome) 03/10/2012  . Diarrhea 03/10/2012  . Abnormal echocardiogram 10/30/2010  . Palpitations   . HTN (hypertension)     Past Surgical History:  Procedure Laterality Date  . BREAST SURGERY     CANCER DIAG 08-2003  . LUMBAR LAMINECTOMY/DECOMPRESSION MICRODISCECTOMY Right 10/31/2016   Procedure: Right Lumbar Five-Sacral One Lumbar laminotomy and microdiscectomy;  Surgeon: Jovita Gamma, MD;  Location: [facility];  Service: Neurosurgery;  Laterality: Right;  Right  . TOE SURGERY    . VAGINAL HYSTERECTOMY       OB History   None      Home Medications     Prior to Admission medications   Medication Sig Start Date End Date Taking? Authorizing Provider  amitriptyline (ELAVIL) 50 MG tablet Take 2 tablets (100 mg total) by mouth at bedtime. Patient taking differently: Take 50 mg by mouth at bedtime.  10/02/16   Garvin Fila, MD  Calcium Carbonate-Vitamin D (CALCIUM 600+D PO) Take 2 tablets by mouth at bedtime.    [provider]  clonazePAM (KLONOPIN) 0.5 MG tablet Take 1 tablet (0.5 mg total) by mouth 2 (two) times daily. Patient taking differently: Take 0.5 mg by mouth at bedtime.  02/28/16 02/27/17  Cloria Spring, MD  diclofenac (VOLTAREN) 75 MG EC tablet Take 75 mg by mouth daily as needed for moderate pain.  09/16/16   [provider]  FLUoxetine (PROZAC) 40 MG capsule TAKE 1 CAPSULE BY MOUTH DAILY. 11/14/16   Cloria Spring, MD  HYDROcodone-acetaminophen (NORCO/VICODIN) 5-325 MG tablet Take 1-2 tablets by mouth every 4 (four) hours as needed (pain). 11/01/16   Jovita Gamma, MD  magnesium oxide (MAG-OX) 400 MG tablet Take 400 mg by mouth at bedtime.    [provider]  metoprolol (TOPROL-XL) 50 MG 24 hr tablet Take 50 mg by mouth daily.      [provider]  Multiple Vitamin (MULTIVITAMIN WITH MINERALS) TABS tablet Take 1 tablet by mouth daily.  [provider]  naproxen sodium (ANAPROX) 220 MG tablet Take 440 mg by mouth daily as needed (pain).    [provider]  oxybutynin (DITROPAN XL) 15 MG 24 hr tablet Take 15 mg by mouth daily.  09/14/16   [provider]  pantoprazole (PROTONIX) 40 MG tablet Take 40 mg by mouth daily.  02/21/15   [provider]  simvastatin (ZOCOR) 20 MG tablet Take 20 mg by mouth daily.  05/14/13   [provider]    Family History Family History  Problem Relation Age of Onset  . Anxiety disorder Mother   . Depression Brother   . Stroke Maternal Grandfather     Social History Social History   Tobacco Use  . Smoking status:  Former Smoker    Packs/day: 1.00    Years: 35.00    Pack years: 35.00    Types: Cigarettes    Last attempt to quit: 05/10/2008    Years since quitting: 9.5  . Smokeless tobacco: Never Used  . Tobacco comment: Quit in 2009  Substance Use Topics  . Alcohol use: Yes    Comment: occasional  . Drug use: No     Allergies   Ancef [cefazolin sodium]   Review of Systems Review of Systems  Constitutional: Negative for fatigue and fever.  Respiratory: Negative for cough, chest tightness and shortness of breath.   Cardiovascular: Positive for chest pain. Negative for palpitations.  Gastrointestinal: Negative for abdominal pain, constipation, diarrhea, nausea and vomiting.  Musculoskeletal: Negative for back pain, gait problem and neck pain.  Neurological: Negative for dizziness, syncope, light-headedness and headaches.  All other systems reviewed and are negative.    Physical Exam Updated Vital Signs BP 139/75   Pulse 87   Temp 98.6 F (37 C) (Oral)   Resp 17   SpO2 97%   Physical Exam  Constitutional: She is oriented to person, place, and time. She appears well-developed and well-nourished.  HENT:  Head: Normocephalic and atraumatic.  Cardiovascular: Normal rate, regular rhythm, normal heart sounds and intact distal pulses.  Pulmonary/Chest: Effort normal and breath sounds normal.  Abdominal: Soft. She exhibits no distension. There is no tenderness.  Musculoskeletal:  Significant point tenderness at right humoral head. Tenderness to palpation of right chest below breast. Tenderness to palpation of right knee.  Neurological: She is alert and oriented to person, place, and time.  Skin: Skin is warm and dry.  Psychiatric: She has a normal mood and affect.      ED Treatments / Results  Labs (all labs ordered are listed, but only abnormal results are displayed) Labs Reviewed - No data to display  EKG None  Radiology  EXAM: RIGHT RIBS AND CHEST - 3+  VIEW  COMPARISON:  None  FINDINGS: There are age-indeterminate fractures involving the posterior and lateral aspect of the right seventh and eighth ribs. There is no evidence of pneumothorax or pleural effusion. Both lungs are clear. Heart size and mediastinal contours are within normal limits.  IMPRESSION: 1. Age-indeterminate right posterior rib fracture deformities.  EXAM: RIGHT SHOULDER - 2+ VIEW  COMPARISON:  None.  FINDINGS: Degenerative changes in the [facility] joint with joint space narrowing and spurring. Glenohumeral joint is maintained. No acute bony abnormality. Specifically, no fracture, subluxation, or dislocation. Soft tissues are intact.  IMPRESSION: Mild degenerative changes in the right AC joint. No acute bony abnormality.  Procedures Procedures (including critical care time)  Medications Ordered in ED Medications  acetaminophen (TYLENOL) tablet 1,000 mg (  has no administration in time range)  ibuprofen (ADVIL,MOTRIN) tablet 600 mg (has no administration in time range)     Initial Impression / Assessment and Plan / ED Course  I have reviewed the triage vital signs and the nursing notes.  Pertinent labs & imaging results that were available during my care of the patient were reviewed by me and considered in my medical decision making (see chart for details).  Assessment and Plan:  AZURI BOZARD is a 62 y.o. female with a past medical history of HTN and arthritis who presents after a fall at work and found to have right posterior rib fractures.   #Rib Fractures (right posterior ribs 7-8): -  Rib fractures will likely self-resolve. Early and adequate pain relief is essential to avoid complications from splinting and atelectasis, primarily pneumonia.  - Ordered acetaminophen and ibuprofen in the ED. Patient states that pain has improved since presentation. - Prescribed Hydrocodone-acetaminophen 5-325 mg q6h PRN pain, lidoderm 1 patch q24h PRN pain, and  Iburpofen PRN - Recommend using incentive spirometer to avoid complications - Recommend using sling for 1 week and then removing to avoid complications such as frozen shoulder - Follow-up with orthopedic surgeon in 1-2 weeks if pain worsens.     Final Clinical Impressions(s) / ED Diagnoses   Final diagnoses:  None    ED Discharge Orders    None       Carroll Sage, MD 12/09/17 3734    Blanchie Dessert, MD 12/09/17 1149

## 2018-06-18 ENCOUNTER — Other Ambulatory Visit (HOSPITAL_COMMUNITY): Payer: Self-pay | Admitting: Urology

## 2018-06-18 ENCOUNTER — Other Ambulatory Visit: Payer: Self-pay | Admitting: Urology

## 2018-06-18 DIAGNOSIS — N289 Disorder of kidney and ureter, unspecified: Secondary | ICD-10-CM

## 2018-06-19 ENCOUNTER — Other Ambulatory Visit (HOSPITAL_COMMUNITY): Payer: Self-pay | Admitting: Urology

## 2018-06-19 ENCOUNTER — Other Ambulatory Visit: Payer: Self-pay | Admitting: Urology

## 2018-06-19 DIAGNOSIS — N289 Disorder of kidney and ureter, unspecified: Secondary | ICD-10-CM

## 2018-07-01 ENCOUNTER — Other Ambulatory Visit: Payer: Self-pay | Admitting: Physician Assistant

## 2018-07-02 ENCOUNTER — Encounter (HOSPITAL_COMMUNITY): Payer: Self-pay

## 2018-07-02 ENCOUNTER — Ambulatory Visit (HOSPITAL_COMMUNITY)
Admission: RE | Admit: 2018-07-02 | Discharge: 2018-07-02 | Disposition: A | Discharge status: Home / Self Care | Payer: 59 | Source: Ambulatory Visit | Attending: Urology | Admitting: Urology

## 2018-07-02 ENCOUNTER — Other Ambulatory Visit: Payer: Self-pay

## 2018-07-02 DIAGNOSIS — N289 Disorder of kidney and ureter, unspecified: Secondary | ICD-10-CM | POA: Diagnosis not present

## 2018-07-02 DIAGNOSIS — E785 Hyperlipidemia, unspecified: Secondary | ICD-10-CM | POA: Insufficient documentation

## 2018-07-02 DIAGNOSIS — Z87891 Personal history of nicotine dependence: Secondary | ICD-10-CM | POA: Diagnosis not present

## 2018-07-02 DIAGNOSIS — Z853 Personal history of malignant neoplasm of breast: Secondary | ICD-10-CM | POA: Diagnosis not present

## 2018-07-02 DIAGNOSIS — K219 Gastro-esophageal reflux disease without esophagitis: Secondary | ICD-10-CM | POA: Insufficient documentation

## 2018-07-02 DIAGNOSIS — Z79899 Other long term (current) drug therapy: Secondary | ICD-10-CM | POA: Insufficient documentation

## 2018-07-02 DIAGNOSIS — I10 Essential (primary) hypertension: Secondary | ICD-10-CM | POA: Diagnosis not present

## 2018-07-02 LAB — CBC
HCT: 44.8 % (ref 36.0–46.0)
HEMOGLOBIN: 14.3 g/dL (ref 12.0–15.0)
MCH: 29.4 pg (ref 26.0–34.0)
MCHC: 31.9 g/dL (ref 30.0–36.0)
MCV: 92 fL (ref 80.0–100.0)
Platelets: 278 10*3/uL (ref 150–400)
RBC: 4.87 MIL/uL (ref 3.87–5.11)
RDW: 12.9 % (ref 11.5–15.5)
WBC: 11 10*3/uL — ABNORMAL HIGH (ref 4.0–10.5)
nRBC: 0 % (ref 0.0–0.2)

## 2018-07-02 LAB — PROTIME-INR
INR: 0.92
PROTHROMBIN TIME: 12.3 s (ref 11.4–15.2)

## 2018-07-02 MED ORDER — LIDOCAINE HCL (PF) 1 % IJ SOLN
INTRAMUSCULAR | Status: AC | PRN
  Administered 2018-07-02: 10 mL

## 2018-07-02 MED ORDER — MIDAZOLAM HCL 2 MG/2ML IJ SOLN
INTRAMUSCULAR | Status: AC | PRN
  Administered 2018-07-02 (×3): 1 mg via INTRAVENOUS

## 2018-07-02 MED ORDER — SODIUM CHLORIDE 0.9 % IV SOLN
INTRAVENOUS | Status: DC
  Administered 2018-07-02: 08:00:00 via INTRAVENOUS

## 2018-07-02 MED ORDER — MIDAZOLAM HCL 2 MG/2ML IJ SOLN
INTRAMUSCULAR | Status: AC
  Filled 2018-07-02: qty 4

## 2018-07-02 MED ORDER — FENTANYL CITRATE (PF) 100 MCG/2ML IJ SOLN
INTRAMUSCULAR | Status: AC
  Filled 2018-07-02: qty 2

## 2018-07-02 MED ORDER — FENTANYL CITRATE (PF) 100 MCG/2ML IJ SOLN
INTRAMUSCULAR | Status: AC | PRN
  Administered 2018-07-02 (×2): 50 ug via INTRAVENOUS

## 2018-07-02 NOTE — Discharge Instructions (Signed)
Moderate Conscious Sedation, Adult, Care After These instructions provide you with information about caring for yourself after your procedure. Your health care provider may also give you more specific instructions. Your treatment has been planned according to current medical practices, but problems sometimes occur. Call your health care provider if you have any problems or questions after your procedure. What can I expect after the procedure? After your procedure, it is common:  To feel sleepy for several hours.  To feel clumsy and have poor balance for several hours.  To have poor judgment for several hours.  To vomit if you eat too soon. Follow these instructions at home: For at least 24 hours after the procedure:   Do not: ? Participate in activities where you could fall or become injured. ? Drive. ? Use heavy machinery. ? Drink alcohol. ? Take sleeping pills or medicines that cause drowsiness. ? Make important decisions or sign legal documents. ? Take care of children on your own.  Rest. Eating and drinking  Follow the diet recommended by your health care provider.  If you vomit: ? Drink water, juice, or soup when you can drink without vomiting. ? Make sure you have little or no nausea before eating solid foods. General instructions  Have a responsible adult stay with you until you are awake and alert.  Take over-the-counter and prescription medicines only as told by your health care provider.  If you smoke, do not smoke without supervision.  Keep all follow-up visits as told by your health care provider. This is important. Contact a health care provider if:  You keep feeling nauseous or you keep vomiting.  You feel light-headed.  You develop a rash.  You have a fever. Get help right away if:  You have trouble breathing. This information is not intended to replace advice given to you by your health care provider. Make sure you discuss any questions you have  with your health care provider. Document Released: 03/17/2013 Document Revised: 10/30/2015 Document Reviewed: 09/16/2015 Elsevier Interactive Patient Education  2019 [place] may remove your dressing and shower tomorrow.   Needle Biopsy, Care After These instructions tell you how to care for yourself after your procedure. Your doctor may also give you more specific instructions. Call your doctor if you have any problems or questions. What can I expect after the procedure? After the procedure, it is common to have:  Soreness.  Bruising.  Mild pain. Follow these instructions at home:   Return to your normal activities as told by your doctor. Ask your doctor what activities are safe for you.  Take over-the-counter and prescription medicines only as told by your doctor.  Wash your hands with soap and water before you change your bandage (dressing). If you cannot use soap and water, use hand sanitizer.  Follow instructions from your doctor about: ? How to take care of your puncture site. ? When and how to change your bandage. ? When to remove your bandage.  Check your puncture site every day for signs of infection. Watch for: ? Redness, swelling, or pain. ? Fluid or blood. ? Pus or a bad smell. ? Warmth.  Do not take baths, swim, or use a hot tub until your doctor approves. Ask your doctor if you may take showers. You may only be allowed to take sponge baths.  Keep all follow-up visits as told by your doctor. This is important. Contact a doctor if you have:  A fever.  Redness, swelling, or  pain at the puncture site, and it lasts longer than a few days.  Fluid, blood, or pus coming from the puncture site.  Warmth coming from the puncture site. Get help right away if:  You have a lot of bleeding from the puncture site. Summary  After the procedure, it is common to have soreness, bruising, or mild pain at the puncture site.  Check your puncture site every  day for signs of infection, such as redness, swelling, or pain.  Get help right away if you have severe bleeding from your puncture site. This information is not intended to replace advice given to you by your health care provider. Make sure you discuss any questions you have with your health care provider. Document Released: 05/09/2008 Document Revised: 06/09/2017 Document Reviewed: 06/09/2017 Elsevier Interactive Patient Education  2019 [employer].

## 2018-07-02 NOTE — H&P (Signed)
Chief Complaint: Patient was seen in consultation today for right peri-renal/retroperitoneal enlarging cystic lesion.  Referring Physician(s): McKenzie,Patrick L  Supervising Physician: Daryll Brod  Patient Status: [facility] - Out-pt  History of Present Illness: Kaitlyn Santana is a 63 y.o. female with a past medical history of hypertension, hyperlipidemia, asthma, bronchitis, GERD, IBD, breast cancer (left), arthritis, depression, and anxiety. She has back pain and was found to have a right peri-renal/retroperitoneal enlarging cystic lesion on CT.  IR requested by Dr. Alyson Ingles for possible image-guided right peri-renal/retroperitoneal enlarging cystic lesion aspiration. Patient awake and alert laying in bed with no complaints at this time. Accompanied by husband at bedside. Denies fever, chills, chest pain, dyspnea, abdominal pain, or headache.   Past Medical History:  Diagnosis Date  . Anxiety   . Arthritis   . Asthma    as a child only  . Bronchitis   . Cancer of left breast ([place]) 08/2003   under care of Dr. Sonny Dandy  . Depression   . GERD (gastroesophageal reflux disease)   . Headache   . HTN (hypertension)   . Hyperlipidemia   . Irritable bowel disease   . Lumbar herniated disc   . Other dyspnea and respiratory abnormality   . Palpitations     Past Surgical History:  Procedure Laterality Date  . BREAST SURGERY     CANCER DIAG 08-2003  . LUMBAR LAMINECTOMY/DECOMPRESSION MICRODISCECTOMY Right 10/31/2016   Procedure: Right Lumbar Five-Sacral One Lumbar laminotomy and microdiscectomy;  Surgeon: Jovita Gamma, MD;  Location: [facility];  Service: Neurosurgery;  Laterality: Right;  Right  . TOE SURGERY    . VAGINAL HYSTERECTOMY      Allergies: Ancef [cefazolin sodium]  Medications: Prior to Admission medications   Medication Sig Start Date End Date Taking? Authorizing Provider  Calcium Carbonate-Vitamin D (CALCIUM 600+D PO) Take 2 tablets by mouth at bedtime.   Yes  [provider]  diclofenac (VOLTAREN) 75 MG EC tablet Take 75 mg by mouth daily as needed for moderate pain.  09/16/16  Yes [provider]  FLUoxetine (PROZAC) 40 MG capsule TAKE 1 CAPSULE BY MOUTH DAILY. 11/14/16  Yes Cloria Spring, MD  magnesium oxide (MAG-OX) 400 MG tablet Take 400 mg by mouth at bedtime.   Yes [provider]  metoprolol (TOPROL-XL) 50 MG 24 hr tablet Take 50 mg by mouth daily.     Yes [provider]  Multiple Vitamin (MULTIVITAMIN WITH MINERALS) TABS tablet Take 1 tablet by mouth daily.   Yes [provider]  naproxen sodium (ANAPROX) 220 MG tablet Take 440 mg by mouth daily as needed (pain).   Yes [provider]  pantoprazole (PROTONIX) 40 MG tablet Take 40 mg by mouth daily.  02/21/15  Yes [provider]  simvastatin (ZOCOR) 20 MG tablet Take 20 mg by mouth daily.  05/14/13  Yes [provider]  amitriptyline (ELAVIL) 50 MG tablet Take 2 tablets (100 mg total) by mouth at bedtime. Patient taking differently: Take 50 mg by mouth at bedtime.  10/02/16   Garvin Fila, MD  clonazePAM (KLONOPIN) 0.5 MG tablet Take 1 tablet (0.5 mg total) by mouth 2 (two) times daily. Patient taking differently: Take 0.5 mg by mouth at bedtime.  02/28/16 02/27/17  Cloria Spring, MD  lidocaine (LIDODERM) 5 % Place 1 patch onto the skin daily. Remove & Discard patch within 12 hours or as directed by MD 12/09/17 12/09/18  Carroll Sage, MD  oxybutynin (DITROPAN XL)  15 MG 24 hr tablet Take 15 mg by mouth daily.  09/14/16   [provider]     Family History  Problem Relation Age of Onset  . Anxiety disorder Mother   . Depression Brother   . Stroke Maternal Grandfather     Social History   Socioeconomic History  . Marital status: Married    Spouse name: RONALD  . Number of children: 1  . Years of education: Not on file  . Highest education level: Not on file  Occupational History  . Occupation: [employer]     Employer: [employer]  Social Needs  . Financial resource strain: Not on file  . Food insecurity:    Worry: Not on file    Inability: Not on file  . Transportation needs:    Medical: Not on file    Non-medical: Not on file  Tobacco Use  . Smoking status: Former Smoker    Packs/day: 1.00    Years: 35.00    Pack years: 35.00    Types: Cigarettes    Last attempt to quit: 05/10/2008    Years since quitting: 10.1  . Smokeless tobacco: Never Used  . Tobacco comment: Quit in 2009  Substance and Sexual Activity  . Alcohol use: Yes    Comment: occasional  . Drug use: No  . Sexual activity: Yes  Lifestyle  . Physical activity:    Days per week: Not on file    Minutes per session: Not on file  . Stress: Not on file  Relationships  . Social connections:    Talks on phone: Not on file    Gets together: Not on file    Attends religious service: Not on file    Active member of club or organization: Not on file    Attends meetings of clubs or organizations: Not on file    Relationship status: Not on file  Other Topics Concern  . Not on file  Social History Narrative  . Not on file     Review of Systems: A 12 point ROS discussed and pertinent positives are indicated in the HPI above.  All other systems are negative.  Review of Systems  Constitutional: Negative for chills and fever.  Respiratory: Negative for shortness of breath and wheezing.   Cardiovascular: Negative for chest pain and palpitations.  Gastrointestinal: Negative for abdominal pain.  Neurological: Negative for headaches.  Psychiatric/Behavioral: Negative for behavioral problems and confusion.    Vital Signs: BP 111/82 (BP Location: Right Arm)   Pulse 72   Temp 98.2 F (36.8 C) (Oral)   Resp 18   SpO2 96%   Physical Exam Vitals signs and nursing note reviewed.  Constitutional:      General: She is not in acute distress.    Appearance: Normal appearance.  Cardiovascular:     Rate and Rhythm:  Normal rate and regular rhythm.     Heart sounds: Normal heart sounds. No murmur.  Pulmonary:     Effort: Pulmonary effort is normal. No respiratory distress.     Breath sounds: Normal breath sounds. No wheezing.  Skin:    General: Skin is warm and dry.  Neurological:     Mental Status: She is alert and oriented to person, place, and time.  Psychiatric:        Mood and Affect: Mood normal.        Behavior: Behavior normal.        Thought Content: Thought content normal.  Judgment: Judgment normal.      MD Evaluation Airway: WNL Heart: WNL Abdomen: WNL Chest/ Lungs: WNL ASA  Classification: 2 Mallampati/Airway Score: One   Imaging: No results found.  Labs:  CBC: Recent Labs    07/02/18 0735  WBC 11.0*  HGB 14.3  HCT 44.8  PLT 278    COAGS: Recent Labs    07/02/18 0735  INR 0.92    BMP: No results for input(s): NA, K, CL, CO2, GLUCOSE, BUN, CALCIUM, CREATININE, GFRNONAA, GFRAA in the last 8760 hours.  Invalid input(s): CMP  LIVER FUNCTION TESTS: No results for input(s): BILITOT, AST, ALT, ALKPHOS, PROT, ALBUMIN in the last 8760 hours.  TUMOR MARKERS: No results for input(s): AFPTM, CEA, CA199, CHROMGRNA in the last 8760 hours.  Assessment and Plan:  Right peri-renal/retroperitoneal enlarging cystic lesion. Plan for image-guided right peri-renal/retroperitoneal enlarging cystic lesion aspiration today with Dr. Annamaria Boots. Patient is NPO. Afebrile. She does not take blood thinners. INR 0.92 seconds this AM.  Risks and benefits discussed with the patient including, but not limited to bleeding, infection, damage to adjacent structures or low yield requiring additional tests. All of the patient's questions were answered, patient is agreeable to proceed. Consent signed and in chart.   Thank you for this interesting consult.  I greatly enjoyed meeting Suhailah A Beauregard and look forward to participating in their care.  A copy of this report was sent to the  requesting provider on this date.  Electronically Signed: Earley Abide, PA-C 07/02/2018, 8:21 AM   I spent a total of 30 Minutes in face to face in clinical consultation, greater than 50% of which was counseling/coordinating care for right peri-renal/retroperitoneal enlarging cystic lesion.

## 2018-07-02 NOTE — Procedures (Signed)
RT PERIRENAL RP CYSTIC MASS  S/P CT ASPIRATION FOR CYTO NO COMP STABLE EBL 0 PATH PENDING

## 2018-07-28 ENCOUNTER — Ambulatory Visit (INDEPENDENT_AMBULATORY_CARE_PROVIDER_SITE_OTHER): Payer: Self-pay | Admitting: Internal Medicine

## 2018-08-04 ENCOUNTER — Ambulatory Visit (INDEPENDENT_AMBULATORY_CARE_PROVIDER_SITE_OTHER): Payer: Self-pay | Admitting: Internal Medicine

## 2018-08-10 ENCOUNTER — Telehealth (INDEPENDENT_AMBULATORY_CARE_PROVIDER_SITE_OTHER): Payer: Self-pay | Admitting: *Deleted

## 2018-08-10 ENCOUNTER — Encounter (INDEPENDENT_AMBULATORY_CARE_PROVIDER_SITE_OTHER): Payer: Self-pay | Admitting: Internal Medicine

## 2018-08-10 ENCOUNTER — Encounter (INDEPENDENT_AMBULATORY_CARE_PROVIDER_SITE_OTHER): Payer: Self-pay | Admitting: *Deleted

## 2018-08-10 ENCOUNTER — Ambulatory Visit (INDEPENDENT_AMBULATORY_CARE_PROVIDER_SITE_OTHER): Payer: 59 | Admitting: Internal Medicine

## 2018-08-10 VITALS — BP 143/87 | HR 78 | Temp 98.2°F | Ht 66.5 in | Wt 182.5 lb

## 2018-08-10 DIAGNOSIS — K625 Hemorrhage of anus and rectum: Secondary | ICD-10-CM | POA: Diagnosis not present

## 2018-08-10 HISTORY — DX: Hemorrhage of anus and rectum: K62.5

## 2018-08-10 LAB — HEMOGLOBIN AND HEMATOCRIT, BLOOD
HCT: 40.3 % (ref 35.0–45.0)
Hemoglobin: 13.6 g/dL (ref 11.7–15.5)

## 2018-08-10 MED ORDER — SUPREP BOWEL PREP KIT 17.5-3.13-1.6 GM/177ML PO SOLN: 1.0000 | Freq: Once | ORAL | 0 refills | Status: AC

## 2018-08-10 NOTE — Patient Instructions (Addendum)
The risks of bleeding, perforation and infection were reviewed with patient. H and H today.   

## 2018-08-10 NOTE — Progress Notes (Signed)
Subjective:    Patient ID: Kaitlyn Santana, female    DOB: 09-21-1955, 63 y.o.   MRN: [id number]  HPI Referred by Dr. Woody Seller for rectal bleeding.  She tells me in January she woke up on a Saturday morning, within a couple of hours she went to the [facility] x 7 with blood in her stools. Symptoms x 1 day. Seen at Urgent Care same day and was given an Rx for Prilosec. She says the bleeding was BRRB. Has has some rectal bleeding since January but not a lot.  No pain associated with the rectal bleeding. She says her stools are back to normal. She does have constipation/diarrhea which is normal for her. Her appetite is okay. No weight loss. No family hx of colon cancer that she knows of.     Her last colonoscopy was in 2010 (Non bloody diarrhea with negative stool studies). Normal terminal ileum. Scattered diverticula at sigmoid and descending colon.  No endoscopic evidence of colitis.    Review of Systems Past Medical History:  Diagnosis Date  . Anxiety   . Arthritis   . Asthma    as a child only  . Bronchitis   . Cancer of left breast ([place]) 08/2003   under care of Dr. Sonny Dandy  . Depression   . GERD (gastroesophageal reflux disease)   . Headache   . HTN (hypertension)   . Hyperlipidemia   . Irritable bowel disease   . Lumbar herniated disc   . Other dyspnea and respiratory abnormality   . Palpitations   . Rectal bleeding 08/10/2018    Past Surgical History:  Procedure Laterality Date  . BREAST SURGERY     CANCER DIAG 08-2003  . LUMBAR LAMINECTOMY/DECOMPRESSION MICRODISCECTOMY Right 10/31/2016   Procedure: Right Lumbar Five-Sacral One Lumbar laminotomy and microdiscectomy;  Surgeon: Jovita Gamma, MD;  Location: [facility];  Service: Neurosurgery;  Laterality: Right;  Right  . TOE SURGERY    . VAGINAL HYSTERECTOMY      Allergies  Allergen Reactions  . Ancef [Cefazolin Sodium] Hives and Itching    Current Outpatient Medications on File Prior to Visit  Medication Sig Dispense Refill  .  diclofenac (VOLTAREN) 75 MG EC tablet Take 75 mg by mouth daily as needed for moderate pain.     . metoprolol (TOPROL-XL) 50 MG 24 hr tablet Take 50 mg by mouth daily.      . Multiple Vitamins-Minerals (CENTRUM SILVER 50+WOMEN PO) Take by mouth.    . pantoprazole (PROTONIX) 40 MG tablet Take 40 mg by mouth daily.     . simvastatin (ZOCOR) 20 MG tablet Take 20 mg by mouth daily.     . solifenacin (VESICARE) 5 MG tablet Take 5 mg by mouth daily.    . clonazePAM (KLONOPIN) 0.5 MG tablet Take 1 tablet (0.5 mg total) by mouth 2 (two) times daily. (Patient taking differently: Take 0.5 mg by mouth at bedtime. ) 60 tablet 3   No current facility-administered medications on file prior to visit.         Objective:   Physical Exam Blood pressure (!) 143/87, pulse 78, temperature 98.2 F (36.8 C), height 5' 6.5" (1.689 m), weight 182 lb 8 oz (82.8 kg). Alert and oriented. Skin warm and dry. Oral mucosa is moist.   . Sclera anicteric, conjunctivae is pink. Thyroid not enlarged. No cervical lymphadenopathy. Lungs clear. Heart regular rate and rhythm.  Abdomen is soft. Bowel sounds are positive. No hepatomegaly. No abdominal masses felt. No  tenderness.  No edema to lower extremities. Rectal exam: No masses. Guaiac negative.         Assessment & Plan:  Rectal bleeding. Colonic neoplasm needs to be ruled out. Colonoscopy. The risks of bleeding, perforation and infection were reviewed with patient. H and H today.

## 2018-08-10 NOTE — Telephone Encounter (Signed)
Patient needs suprep 

## 2018-10-07 ENCOUNTER — Encounter (HOSPITAL_COMMUNITY): Admission: RE | Payer: Self-pay | Source: Home / Self Care

## 2018-10-07 ENCOUNTER — Ambulatory Visit (HOSPITAL_COMMUNITY): Admission: RE | Admit: 2018-10-07 | Payer: 59 | Source: Home / Self Care | Admitting: Internal Medicine

## 2018-10-07 SURGERY — COLONOSCOPY
Anesthesia: Moderate Sedation

## 2018-11-19 ENCOUNTER — Telehealth (INDEPENDENT_AMBULATORY_CARE_PROVIDER_SITE_OTHER): Payer: Self-pay | Admitting: *Deleted

## 2018-11-19 NOTE — Telephone Encounter (Signed)
Patient was sch'd 10/07/18 for TCS (rectal bleeding) and we had to cancel to due COVID restrictions are [facility] -- left several message for patient to give me a call so I can get her back on schedule since restrictions have been lifted

## 2019-05-04 ENCOUNTER — Encounter (INDEPENDENT_AMBULATORY_CARE_PROVIDER_SITE_OTHER): Payer: Self-pay | Admitting: *Deleted

## 2019-06-08 ENCOUNTER — Other Ambulatory Visit: Payer: Self-pay | Admitting: Urology

## 2019-12-17 ENCOUNTER — Other Ambulatory Visit: Payer: Self-pay

## 2020-03-01 ENCOUNTER — Encounter (INDEPENDENT_AMBULATORY_CARE_PROVIDER_SITE_OTHER): Payer: Self-pay | Admitting: *Deleted

## 2020-05-22 ENCOUNTER — Encounter (INDEPENDENT_AMBULATORY_CARE_PROVIDER_SITE_OTHER): Payer: Self-pay | Admitting: *Deleted

## 2020-06-14 ENCOUNTER — Other Ambulatory Visit: Payer: Self-pay

## 2020-06-14 ENCOUNTER — Ambulatory Visit (INDEPENDENT_AMBULATORY_CARE_PROVIDER_SITE_OTHER): Payer: 59 | Admitting: Urology

## 2020-06-14 ENCOUNTER — Encounter: Payer: Self-pay | Admitting: Urology

## 2020-06-14 VITALS — BP 121/64 | HR 88 | Temp 98.4°F | Ht 66.5 in | Wt 183.0 lb

## 2020-06-14 DIAGNOSIS — N3941 Urge incontinence: Secondary | ICD-10-CM | POA: Diagnosis not present

## 2020-06-14 DIAGNOSIS — R310 Gross hematuria: Secondary | ICD-10-CM

## 2020-06-14 LAB — POCT URINALYSIS DIPSTICK
Glucose, UA: NEGATIVE
Ketones, UA: NEGATIVE
Leukocytes, UA: NEGATIVE
Nitrite, UA: NEGATIVE
Protein, UA: NEGATIVE
Spec Grav, UA: >=1.03 — AB (ref 1.010–1.025)
Urobilinogen, UA: 0.2 E.U./dL
pH, UA: 5 (ref 5.0–8.0)

## 2020-06-14 NOTE — Progress Notes (Signed)
Urological Symptom Review  Patient is experiencing the following symptoms: Hard to postpone urination Leakage of urine   Review of Systems  Gastrointestinal (upper)  : Negative for upper GI symptoms  Gastrointestinal (lower) : Negative for lower GI symptoms  Constitutional : Negative for symptoms  Skin: Negative for skin symptoms  Eyes: Negative for eye symptoms  Ear/Nose/Throat : Negative for Ear/Nose/Throat symptoms  Hematologic/Lymphatic: Negative for Hematologic/Lymphatic symptoms  Cardiovascular : Negative for cardiovascular symptoms  Respiratory : Negative for respiratory symptoms  Endocrine: Negative for endocrine symptoms  Musculoskeletal: Negative for musculoskeletal symptoms  Neurological: Negative for neurological symptoms  Psychologic: Negative for psychiatric symptoms

## 2020-06-14 NOTE — Progress Notes (Signed)
06/14/2020 1:43 PM   Kaitlyn Santana 05-17-1956 [id number]  Referring provider: Glenda Chroman, MD [place],  [place] [address]  followup hematuria and urge incontinence  HPI: Kaitlyn Santana is a 65yo here for followup for microhematuria and urge incontinence. UA today shows 1+ RBCs. She has stable urgency and urge incontinence on vesicare 5mg  BID. She wears 1-2 pads per day.  The holidays have been difficult this year due to the passing of her husband last Nov 2020   Her records from [facility] are as follows: I have blood in my urine.  HPI: Kaitlyn Santana is a 65 year-old female established patient who is here for blood in the urine.  She did see the blood in her urine. She has not seen blood clots.   She does not have a burning sensation when she urinates. She is not currently having trouble urinating.   She is not having pain. She has not recently had unwanted weight loss.   05/19/2018: She was seen by Dr. Woody Seller and had microscopic hematuria. UA today did not show hematuria but did show CaOx crystals. No dysuria. She has a 30pk year smoking hx.   06/18/2018: CT shows no calculi and no GU tumors. 5cm right retroperitoneal cystic lesions    03/23/2019: UA shows 3-10 RBCs. no gross hematuria   06/18/2019: She has 3-10 RBCs on todays UA. no worsening LUTS.     CC: I leak when I have the urge to urinate.  HPI: She does have problems getting to the bathroom in time after she has the urge to urinate. She has had an accident when she couldn't get to the bathroom in time . She has 2 episodes of urge incontinence per day. Her urge incontinence began approximately 05/11/2015. Her symptoms have gotten worse over the last year.   She does not wear protective pads. She generally urinates every 2 hours in the daytime. She gets up at night to urinate 2 times. She is not having problems with emptying her bladder well.   05/19/2018: She has previously tried oxybutynin with mixed results   06/18/2018: She  had significant improvement in her incontinence with mirabegron 25mg    03/23/2019: She is currently on vesicare 5mg  BID with okay results. She was previously on toviaz, oxybutynin and mirabegron. Nocturia 1-2x. Frequency during the day is q 1-2 hours. She wears a pad which is better than the pullups she was wearing.        PMH: Past Medical History:  Diagnosis Date  . Anxiety   . Arthritis   . Asthma    as a child only  . Bronchitis   . Cancer of left breast ([place]) 08/2003   under care of Dr. Sonny Dandy  . Depression   . GERD (gastroesophageal reflux disease)   . Headache   . HTN (hypertension)   . Hyperlipidemia   . Irritable bowel disease   . Lumbar herniated disc   . Other dyspnea and respiratory abnormality   . Palpitations   . Rectal bleeding 08/10/2018    Surgical History: Past Surgical History:  Procedure Laterality Date  . BREAST SURGERY     CANCER DIAG 08-2003  . LUMBAR LAMINECTOMY/DECOMPRESSION MICRODISCECTOMY Right 10/31/2016   Procedure: Right Lumbar Five-Sacral One Lumbar laminotomy and microdiscectomy;  Surgeon: Jovita Gamma, MD;  Location: [facility];  Service: Neurosurgery;  Laterality: Right;  Right  . TOE SURGERY    . VAGINAL HYSTERECTOMY      Home Medications:  Allergies  as of 06/14/2020      Reactions   Bupropion Itching   Ancef [cefazolin Sodium] Hives, Itching   Ioxaglate Itching      Medication List       Accurate as of June 14, 2020  1:43 PM. If you have any questions, ask your nurse or doctor.        CENTRUM SILVER 50+WOMEN PO Take by mouth.   clonazePAM 0.5 MG tablet Commonly known as: KlonoPIN Take 1 tablet (0.5 mg total) by mouth 2 (two) times daily. What changed: when to take this   diclofenac 75 MG EC tablet Commonly known as: VOLTAREN Take 75 mg by mouth daily as needed for moderate pain.   Hycodan 5-1.5 MG/5ML syrup Generic drug: HYDROcodone-homatropine TAKE ONE TEASPOONFUL (5 ML) BY MOUTH EVERY 6 HOURS AS NEEDED FOR cough    ibuprofen 600 MG tablet Commonly known as: ADVIL Take 600 mg by mouth 3 (three) times daily.   metoprolol succinate 50 MG 24 hr tablet Commonly known as: TOPROL-XL Take 50 mg by mouth daily.   pantoprazole 40 MG tablet Commonly known as: PROTONIX Take 40 mg by mouth daily.   rOPINIRole 0.5 MG tablet Commonly known as: REQUIP Take 0.5 mg by mouth at bedtime.   rosuvastatin 10 MG tablet Commonly known as: CRESTOR Take 10 mg by mouth daily.   simvastatin 20 MG tablet Commonly known as: ZOCOR Take 20 mg by mouth daily.   solifenacin 5 MG tablet Commonly known as: VESICARE TAKE 1 TABLET BY MOUTH TWICE DAILY   traZODone 50 MG tablet Commonly known as: DESYREL   venlafaxine XR 75 MG 24 hr capsule Commonly known as: EFFEXOR-XR Take 75 mg by mouth daily.       Allergies:  Allergies  Allergen Reactions  . Bupropion Itching  . Ancef [Cefazolin Sodium] Hives and Itching  . Ioxaglate Itching    Family History: Family History  Problem Relation Age of Onset  . Anxiety disorder Mother   . Depression Brother   . Stroke Maternal Grandfather     Social History:  reports that she quit smoking about 12 years ago. Her smoking use included cigarettes. She has a 35.00 pack-year smoking history. She has never used smokeless tobacco. She reports current alcohol use. She reports that she does not use drugs.  ROS: All other review of systems were reviewed and are negative except what is noted above in HPI  Physical Exam: BP 121/64   Pulse 88   Temp 98.4 F (36.9 C)   Ht 5' 6.5" (1.689 m)   Wt 183 lb (83 kg)   BMI 29.09 kg/m   Constitutional:  Alert and oriented, No acute distress. HEENT: [place] AT, moist mucus membranes.  Trachea midline, no masses. Cardiovascular: No clubbing, cyanosis, or edema. Respiratory: Normal respiratory effort, no increased work of breathing. GI: Abdomen is soft, nontender, nondistended, no abdominal masses GU: No CVA tenderness.  Lymph: No  cervical or inguinal lymphadenopathy. Skin: No rashes, bruises or suspicious lesions. Neurologic: Grossly intact, no focal deficits, moving all 4 extremities. Psychiatric: Normal mood and affect.  Laboratory Data: Lab Results  Component Value Date   WBC 11.0 (H) 07/02/2018   HGB 13.6 08/10/2018   HCT 40.3 08/10/2018   MCV 92.0 07/02/2018   PLT 278 07/02/2018    Lab Results  Component Value Date   CREATININE 0.47 10/31/2016    No results found for: PSA  No results found for: TESTOSTERONE  No results found for: HGBA1C  Urinalysis    Component Value Date/Time   BILIRUBINUR 1+ 06/14/2020 1333   PROTEINUR Negative 06/14/2020 1333   UROBILINOGEN 0.2 06/14/2020 1333   NITRITE neg 06/14/2020 1333   LEUKOCYTESUR Negative 06/14/2020 1333    No results found for: LABMICR, WBCUA, RBCUA, LABEPIT, MUCUS, BACTERIA  Pertinent Imaging:  No results found for this or any previous visit.  No results found for this or any previous visit.  No results found for this or any previous visit.  No results found for this or any previous visit.  No results found for this or any previous visit.  No results found for this or any previous visit.  No results found for this or any previous visit.  No results found for this or any previous visit.   Assessment & Plan:    1. Urge incontinence -Continue vesicare 5mg  BID, if she continues to have issues with memory and concentration we will change her anticholinergic therapy - POCT urinalysis dipstick  2. Gross hematuria -UA today shows 1+ blood. I will see her back in 1 year with UA   No follow-ups on file.  , MD  [facility] [place]

## 2020-06-14 NOTE — Patient Instructions (Signed)

## 2020-07-03 ENCOUNTER — Other Ambulatory Visit: Payer: Self-pay | Admitting: Urology

## 2020-10-03 ENCOUNTER — Encounter (HOSPITAL_COMMUNITY): Payer: Self-pay | Admitting: Emergency Medicine

## 2020-10-03 ENCOUNTER — Emergency Department (HOSPITAL_COMMUNITY)
Admission: EM | Admit: 2020-10-03 | Discharge: 2020-10-03 | Disposition: A | Discharge status: Home / Self Care | Payer: 59 | Attending: Emergency Medicine | Admitting: Emergency Medicine

## 2020-10-03 ENCOUNTER — Emergency Department (HOSPITAL_COMMUNITY): Payer: 59

## 2020-10-03 ENCOUNTER — Other Ambulatory Visit: Payer: Self-pay

## 2020-10-03 DIAGNOSIS — Z79899 Other long term (current) drug therapy: Secondary | ICD-10-CM | POA: Insufficient documentation

## 2020-10-03 DIAGNOSIS — I10 Essential (primary) hypertension: Secondary | ICD-10-CM | POA: Insufficient documentation

## 2020-10-03 DIAGNOSIS — Z87891 Personal history of nicotine dependence: Secondary | ICD-10-CM | POA: Insufficient documentation

## 2020-10-03 DIAGNOSIS — Z853 Personal history of malignant neoplasm of breast: Secondary | ICD-10-CM | POA: Insufficient documentation

## 2020-10-03 DIAGNOSIS — R079 Chest pain, unspecified: Secondary | ICD-10-CM | POA: Diagnosis not present

## 2020-10-03 DIAGNOSIS — J45909 Unspecified asthma, uncomplicated: Secondary | ICD-10-CM | POA: Diagnosis not present

## 2020-10-03 LAB — CBC
HCT: 41.8 % (ref 36.0–46.0)
Hemoglobin: 13.3 g/dL (ref 12.0–15.0)
MCH: 29.3 pg (ref 26.0–34.0)
MCHC: 31.8 g/dL (ref 30.0–36.0)
MCV: 92.1 fL (ref 80.0–100.0)
Platelets: 266 10*3/uL (ref 150–400)
RBC: 4.54 MIL/uL (ref 3.87–5.11)
RDW: 12.5 % (ref 11.5–15.5)
WBC: 9.2 10*3/uL (ref 4.0–10.5)
nRBC: 0 % (ref 0.0–0.2)

## 2020-10-03 LAB — BASIC METABOLIC PANEL
Anion gap: 7 (ref 5–15)
BUN: 16 mg/dL (ref 8–23)
CO2: 27 mmol/L (ref 22–32)
Calcium: 9.1 mg/dL (ref 8.9–10.3)
Chloride: 102 mmol/L (ref 98–111)
Creatinine, Ser: 0.49 mg/dL (ref 0.44–1.00)
GFR, Estimated: >60 mL/min (ref >60)
Glucose, Bld: 97 mg/dL (ref 70–99)
Potassium: 4.2 mmol/L (ref 3.5–5.1)
Sodium: 136 mmol/L (ref 135–145)

## 2020-10-03 LAB — TROPONIN I (HIGH SENSITIVITY)
Troponin I (High Sensitivity): 3 ng/L (ref <18)
Troponin I (High Sensitivity): 3 ng/L (ref <18)

## 2020-10-03 NOTE — ED Triage Notes (Signed)
Emergency Medicine Provider Triage Evaluation Note  Kaitlyn Santana , a 65 y.o. female  was evaluated in triage.  Pt complains of chest pain.  Review of Systems  Positive: Chest pain, heaviness Negative: Lightheadedness, dizziness, sob, nausea, diaphoresis  Physical Exam  There were no vitals taken for this visit. Gen:   Awake, no distress   HEENT:  Atraumatic  Resp:  Normal effort  Cardiac:  Normal rate  Abd:   Nondistended, nontender  MSK:   Moves extremities without difficulty  Neuro:  Speech clear   Medical Decision Making  Medically screening exam initiated at 1:08 PM.  Appropriate orders placed.  Ruthell A Roorda was informed that the remainder of the evaluation will be completed by another provider, this initial triage assessment does not replace that evaluation, and the importance of remaining in the ED until their evaluation is complete.  Clinical Impression  Transient chest pain while sitting at work, lasting 20-30 min and resolved without any specific treatment.  Currently CP free.    Domenic Moras, PA-C 10/03/20 1312

## 2020-10-03 NOTE — ED Provider Notes (Signed)
[facility] EMERGENCY DEPARTMENT Provider Note   CSN: [id number] Arrival date & time: 10/03/20  1305     History Chief Complaint  Patient presents with  . Chest Pain    Kaitlyn Santana is a 65 y.o. female.  HPI    65 year old female with history of hypertension, hyperlipidemia, breast cancer 2005, presents with concern for chest pain.  Reports around 13 she developed substernal chest pain, that felt like "someone standing on top of my chest".  Was severe, 8 out of 10 in severity without radiation.  Did not have any associated shortness of breath, nausea, vomiting, diaphoresis or lightheadedness.  Reports the pain lasted for approximately 20 to 30 minutes prior to it being relieved when she belched.  She called her doctor who recommended she come to the emergency department.  No recent surgeries, immobilization or travel.  Does not have any new leg pain or swelling.   Past Medical History:  Diagnosis Date  . Anxiety   . Arthritis   . Asthma    as a child only  . Bronchitis   . Cancer of left breast ([place]) 08/2003   under care of Dr. Sonny Dandy  . Depression   . GERD (gastroesophageal reflux disease)   . Headache   . HTN (hypertension)   . Hyperlipidemia   . Irritable bowel disease   . Lumbar herniated disc   . Other dyspnea and respiratory abnormality   . Palpitations   . Rectal bleeding 08/10/2018    Patient Active Problem List   Diagnosis Date Noted  . Urge incontinence 06/14/2020  . Gross hematuria 06/14/2020  . Rectal bleeding 08/10/2018  . HNP (herniated nucleus pulposus), lumbar 10/31/2016  . Depression, major, recurrent ([place]) 02/17/2013  . IBS (irritable bowel syndrome) 03/10/2012  . Diarrhea 03/10/2012  . Abnormal echocardiogram 10/30/2010  . Palpitations   . HTN (hypertension)     Past Surgical History:  Procedure Laterality Date  . BREAST SURGERY     CANCER DIAG 08-2003  . LUMBAR LAMINECTOMY/DECOMPRESSION MICRODISCECTOMY Right 10/31/2016    Procedure: Right Lumbar Five-Sacral One Lumbar laminotomy and microdiscectomy;  Surgeon: Jovita Gamma, MD;  Location: [facility];  Service: Neurosurgery;  Laterality: Right;  Right  . TOE SURGERY    . VAGINAL HYSTERECTOMY       OB History   No obstetric history on file.     Family History  Problem Relation Age of Onset  . Anxiety disorder Mother   . Depression Brother   . Stroke Maternal Grandfather     Social History   Tobacco Use  . Smoking status: Former Smoker    Packs/day: 1.00    Years: 35.00    Pack years: 35.00    Types: Cigarettes    Quit date: 05/10/2008    Years since quitting: 12.4  . Smokeless tobacco: Never Used  . Tobacco comment: Quit in 2009  Vaping Use  . Vaping Use: Never used  Substance Use Topics  . Alcohol use: Yes    Comment: occasional  . Drug use: No    Home Medications Prior to Admission medications   Medication Sig Start Date End Date Taking? Authorizing Provider  clonazePAM (KLONOPIN) 0.5 MG tablet Take 1 tablet (0.5 mg total) by mouth 2 (two) times daily. Patient taking differently: Take 0.5 mg by mouth at bedtime.  02/28/16 02/27/17  Cloria Spring, MD  diclofenac (VOLTAREN) 75 MG EC tablet Take 75 mg by mouth daily as needed for moderate pain.  09/16/16   [provider]  HYCODAN 5-1.5 MG/5ML syrup TAKE ONE TEASPOONFUL (5 ML) BY MOUTH EVERY 6 HOURS AS NEEDED FOR cough 05/22/20   [provider]  ibuprofen (ADVIL) 600 MG tablet Take 600 mg by mouth 3 (three) times daily. 05/02/20   [provider]  metoprolol (TOPROL-XL) 50 MG 24 hr tablet Take 50 mg by mouth daily.      [provider]  Multiple Vitamins-Minerals (CENTRUM SILVER 50+WOMEN PO) Take by mouth.    [provider]  pantoprazole (PROTONIX) 40 MG tablet Take 40 mg by mouth daily.  02/21/15   [provider]  rOPINIRole (REQUIP) 0.5 MG tablet Take 0.5 mg by mouth at bedtime. 05/31/20   [provider]  rosuvastatin  (CRESTOR) 10 MG tablet Take 10 mg by mouth daily. 05/31/20   [provider]  simvastatin (ZOCOR) 20 MG tablet Take 20 mg by mouth daily. 05/14/13   [provider]  solifenacin (VESICARE) 5 MG tablet TAKE 1 TABLET BY MOUTH TWICE DAILY 07/04/20   Cleon Gustin, MD  traZODone (DESYREL) 50 MG tablet  02/27/20   [provider]  venlafaxine XR (EFFEXOR-XR) 75 MG 24 hr capsule Take 75 mg by mouth daily. 05/16/20   [provider]    Allergies    Bupropion, Ancef [cefazolin sodium], and Ioxaglate  Review of Systems   Review of Systems  Constitutional: Negative for fever.  Respiratory: Negative for cough and shortness of breath.   Cardiovascular: Positive for chest pain.  Gastrointestinal: Negative for abdominal pain, nausea and vomiting.  Musculoskeletal: Negative for back pain.  Skin: Negative for rash.  Neurological: Negative for syncope and light-headedness.    Physical Exam Updated Vital Signs BP 125/89 (BP Location: Left Arm)   Pulse 72   Temp 98 F (36.7 C) (Oral)   Resp 16   SpO2 94%   Physical Exam Constitutional:      General: She is not in acute distress.    Appearance: Normal appearance. She is not ill-appearing or diaphoretic.  HENT:     Head: Normocephalic and atraumatic.  Eyes:     Extraocular Movements: Extraocular movements intact.     Conjunctiva/sclera: Conjunctivae normal.  Neck:     Vascular: No JVD.  Cardiovascular:     Rate and Rhythm: Normal rate and regular rhythm.     Pulses: Normal pulses.     Heart sounds: Normal heart sounds. No murmur heard. No friction rub. No gallop.   Pulmonary:     Effort: Pulmonary effort is normal. No respiratory distress.     Breath sounds: Normal breath sounds. No wheezing, rhonchi or rales.  Chest:     Chest wall: No tenderness.  Abdominal:     General: Abdomen is flat. There is no distension.     Palpations: Abdomen is soft.  Musculoskeletal:     Right lower leg: No edema.      Left lower leg: No edema.  Skin:    General: Skin is warm and dry.     Findings: No erythema.  Neurological:     Mental Status: She is alert and oriented to person, place, and time.     ED Results / Procedures / Treatments   Labs (all labs ordered are listed, but only abnormal results are displayed) Labs Reviewed  BASIC METABOLIC PANEL  CBC  TROPONIN I (HIGH SENSITIVITY)  TROPONIN I (HIGH SENSITIVITY)    EKG EKG Interpretation  Date/Time:  Tuesday October 03 2020  13:16:15 EDT Ventricular Rate:  72 PR Interval:  164 QRS Duration: 94 QT Interval:  432 QTC Calculation: 473 R Axis:   22 Text Interpretation: Normal sinus rhythm Nonspecific T wave abnormality  similar to May 2018 Confirmed by Sherwood Gambler [phone]) on 10/03/2020 2:31:00 PM   Radiology DG Chest 2 View  Result Date: 10/03/2020 CLINICAL DATA:  Chest pain earlier today, now stopped. History of asthma, hypertension, GERD, palpitations EXAM: CHEST - 2 VIEW COMPARISON:  12/09/2017 FINDINGS: Unchanged cardiomediastinal silhouette with tortuous thoracic aorta. There is no new focal airspace disease. No pleural effusion or pneumothorax. Old healed right rib fractures again noted. IMPRESSION: No evidence of acute cardiopulmonary disease. Electronically Signed   By: Maurine Simmering   On: 10/03/2020 14:52    Procedures Procedures   Medications Ordered in ED Medications - No data to display  ED Course  I have reviewed the triage vital signs and the nursing notes.  Pertinent labs & imaging results that were available during my care of the patient were reviewed by me and considered in my medical decision making (see chart for details).    MDM Rules/Calculators/A&P                          65 year old female with history of hypertension, hyperlipidemia, breast cancer 2005, presents with concern for chest pain.  Differential diagnosis for chest pain includes pulmonary embolus, dissection, pneumothorax, pneumonia, ACS,  myocarditis, pericarditis.  EKG was done and evaluate by me and showed no acute ST changes and no signs of pericarditis. Chest x-ray was done and evaluated by me and radiology and showed no sign of pneumonia or pneumothorax. Patient is low risk [place] without dyspnea and have low suspicion for PE.   Do not feel history or exam are consistent with aortic dissection.   Patient is HEART score 4.  Initial troponin negative.  Chest pain free. Discussed we are unable to rule out heart attack at this time given pain and if she were to leave prior to second troponin it would be against medical advice.  Did convince her to stay for blood draw of troponin.  Second troponin returned normal at 3 after she left AMA.  May follow up with PCP.    Final Clinical Impression(s) / ED Diagnoses Final diagnoses:  Chest pain    Rx / DC Orders ED Discharge Orders    None       Gareth Morgan, MD 10/03/20 1756

## 2020-10-03 NOTE — ED Triage Notes (Signed)
Pt BIB [facility] from work, patient was sitting at desk, sudden onset chest pain/pressure, 8/10. Resolved on its own. Took 324 aspirin. Pt with hx of acid reflux.

## 2020-11-23 ENCOUNTER — Telehealth (INDEPENDENT_AMBULATORY_CARE_PROVIDER_SITE_OTHER): Payer: Self-pay

## 2020-11-23 ENCOUNTER — Encounter (INDEPENDENT_AMBULATORY_CARE_PROVIDER_SITE_OTHER): Payer: Self-pay

## 2020-11-23 ENCOUNTER — Other Ambulatory Visit (INDEPENDENT_AMBULATORY_CARE_PROVIDER_SITE_OTHER): Payer: Self-pay

## 2020-11-23 DIAGNOSIS — Z1211 Encounter for screening for malignant neoplasm of colon: Secondary | ICD-10-CM

## 2020-11-23 MED ORDER — PEG 3350-KCL-NA BICARB-NACL 420 G PO SOLR: 4000.0000 mL | ORAL | 0 refills | Status: DC

## 2020-11-23 MED ORDER — SUPREP BOWEL PREP KIT 17.5-3.13-1.6 GM/177ML PO SOLN: 1.0000 | ORAL | 0 refills | Status: DC

## 2020-11-23 NOTE — Telephone Encounter (Signed)
Kaitlyn Santana, CMA  

## 2020-11-23 NOTE — Telephone Encounter (Signed)
Referring MD/PCP: Woody Seller  Procedure: Tcs  Reason/Indication:  Screening  Has patient had this procedure before?  yes  If so, when, by whom and where?  Over 10 yrs ago  Is there a family history of colon cancer?  no  Who?  What age when diagnosed?    Is patient diabetic? If yes, Type 1 or Type 2   no      Does patient have prosthetic heart valve or mechanical valve?  no  Do you have a pacemaker/defibrillator?  no  Has patient ever had endocarditis/atrial fibrillation? no  Does patient use oxygen? no  Has patient had joint replacement within last 12 months?  no  Is patient constipated or do they take laxatives? no  Does patient have a history of alcohol/drug use?  no  Have you had a stroke/heart attack last 6 mths? no  Do you take medicine for weight loss?  no  For female patients,: do you still have your menstrual cycle? no  Is patient on blood thinner such as Coumadin, Plavix and/or Aspirin? no  Medications: Venlafaxine 150mg  daily, venlafaxine 75 mg daily, Centrum Silver daily, rosuvastatin 10 mg daily, diclofenac 75mg  daily, pantoprazole 40 mg daily, metoprolol 50 mg daily, ropinirole 0.5 mg daily  Allergies: ancef, kidney dye  Medication Adjustment per Dr Laural Golden: none  Procedure date & time: 11/29/20 12:50

## 2020-11-24 NOTE — Patient Instructions (Signed)
Kaitlyn Santana  11/24/2020     @PREFPERIOPPHARMACY @   Your procedure is scheduled on 11/29/2020.   Report to [facility] at  1100  A.M.  Call this number if you have problems the morning of surgery:  [phone]   Remember:  Follow the diet and prep instructions given to you by the office.    Take these medicines the morning of surgery with A SIP OF WATER     voltaren, metoprolol, protonix, vesicare, effexor.     Please brush your teeth.  Do not wear jewelry, make-up or nail polish.  Do not wear lotions, powders, or perfumes, or deodorant.  Do not shave 48 hours prior to surgery.  Men may shave face and neck.  Do not bring valuables to the hospital.  [facility] is not responsible for any belongings or valuables.  Contacts, dentures or bridgework may not be worn into surgery.  Leave your suitcase in the car.  After surgery it may be brought to your room.  For patients admitted to the hospital, discharge time will be determined by your treatment team.  Patients discharged the day of surgery will not be allowed to drive home and must have someone with them for 24 hours.    Special instructions:  DO NOT smoke tobacco or vape for 24 hours before your procedure.  Please read over the following fact sheets that you were given. Anesthesia Post-op Instructions and Care and Recovery After Surgery      Colonoscopy, Adult, Care After This sheet gives you information about how to care for yourself after your procedure. Your health care provider may also give you more specific instructions. If you have problems or questions, contact your health careprovider. What can I expect after the procedure? After the procedure, it is common to have: A small amount of blood in your stool for 24 hours after the procedure. Some gas. Mild cramping or bloating of your abdomen. Follow these instructions at home: Eating and drinking  Drink enough fluid to keep your urine pale  yellow. Follow instructions from your health care provider about eating or drinking restrictions. Resume your normal diet as instructed by your health care provider. Avoid heavy or fried foods that are hard to digest.  Activity Rest as told by your health care provider. Avoid sitting for a long time without moving. Get up to take short walks every 1-2 hours. This is important to improve blood flow and breathing. Ask for help if you feel weak or unsteady. Return to your normal activities as told by your health care provider. Ask your health care provider what activities are safe for you. Managing cramping and bloating  Try walking around when you have cramps or feel bloated. Apply heat to your abdomen as told by your health care provider. Use the heat source that your health care provider recommends, such as a moist heat pack or a heating pad. Place a towel between your skin and the heat source. Leave the heat on for 20-30 minutes. Remove the heat if your skin turns bright red. This is especially important if you are unable to feel pain, heat, or cold. You may have a greater risk of getting burned.  General instructions If you were given a sedative during the procedure, it can affect you for several hours. Do not drive or operate machinery until your health care provider says that it is safe. For the first 24 hours after the procedure: Do not  sign important documents. Do not drink alcohol. Do your regular daily activities at a slower pace than normal. Eat soft foods that are easy to digest. Take over-the-counter and prescription medicines only as told by your health care provider. Keep all follow-up visits as told by your health care provider. This is important. Contact a health care provider if: You have blood in your stool 2-3 days after the procedure. Get help right away if you have: More than a small spotting of blood in your stool. Large blood clots in your stool. Swelling of your  abdomen. Nausea or vomiting. A fever. Increasing pain in your abdomen that is not relieved with medicine. Summary After the procedure, it is common to have a small amount of blood in your stool. You may also have mild cramping and bloating of your abdomen. If you were given a sedative during the procedure, it can affect you for several hours. Do not drive or operate machinery until your health care provider says that it is safe. Get help right away if you have a lot of blood in your stool, nausea or vomiting, a fever, or increased pain in your abdomen. This information is not intended to replace advice given to you by your health care provider. Make sure you discuss any questions you have with your healthcare provider. Document Revised: 05/21/2019 Document Reviewed: 12/21/2018 Elsevier Patient Education  [place] After This sheet gives you information about how to care for yourself after your procedure. Your health care provider may also give you more specific instructions. If you have problems or questions, contact your health careprovider. What can I expect after the procedure? After the procedure, it is common to have: Tiredness. Forgetfulness about what happened after the procedure. Impaired judgment for important decisions. Nausea or vomiting. Some difficulty with balance. Follow these instructions at home: For the time period you were told by your health care provider:     Rest as needed. Do not participate in activities where you could fall or become injured. Do not drive or use machinery. Do not drink alcohol. Do not take sleeping pills or medicines that cause drowsiness. Do not make important decisions or sign legal documents. Do not take care of children on your own. Eating and drinking Follow the diet that is recommended by your health care provider. Drink enough fluid to keep your urine pale yellow. If you vomit: Drink water,  juice, or soup when you can drink without vomiting. Make sure you have little or no nausea before eating solid foods. General instructions Have a responsible adult stay with you for the time you are told. It is important to have someone help care for you until you are awake and alert. Take over-the-counter and prescription medicines only as told by your health care provider. If you have sleep apnea, surgery and certain medicines can increase your risk for breathing problems. Follow instructions from your health care provider about wearing your sleep device: Anytime you are sleeping, including during daytime naps. While taking prescription pain medicines, sleeping medicines, or medicines that make you drowsy. Avoid smoking. Keep all follow-up visits as told by your health care provider. This is important. Contact a health care provider if: You keep feeling nauseous or you keep vomiting. You feel light-headed. You are still sleepy or having trouble with balance after 24 hours. You develop a rash. You have a fever. You have redness or swelling around the IV site. Get help right away if: You  have trouble breathing. You have new-onset confusion at home. Summary For several hours after your procedure, you may feel tired. You may also be forgetful and have poor judgment. Have a responsible adult stay with you for the time you are told. It is important to have someone help care for you until you are awake and alert. Rest as told. Do not drive or operate machinery. Do not drink alcohol or take sleeping pills. Get help right away if you have trouble breathing, or if you suddenly become confused. This information is not intended to replace advice given to you by your health care provider. Make sure you discuss any questions you have with your healthcare provider. Document Revised: 02/10/2020 Document Reviewed: 04/29/2019 Elsevier Patient Education  2022 [employer].

## 2020-11-27 ENCOUNTER — Encounter (HOSPITAL_COMMUNITY)
Admission: RE | Admit: 2020-11-27 | Discharge: 2020-11-27 | Disposition: A | Discharge status: Home / Self Care | Payer: 59 | Source: Ambulatory Visit | Attending: Internal Medicine | Admitting: Internal Medicine

## 2020-11-27 ENCOUNTER — Other Ambulatory Visit: Payer: Self-pay

## 2020-11-29 ENCOUNTER — Encounter (HOSPITAL_COMMUNITY): Payer: Self-pay | Admitting: Internal Medicine

## 2020-11-29 ENCOUNTER — Ambulatory Visit (HOSPITAL_COMMUNITY): Payer: 59 | Admitting: Anesthesiology

## 2020-11-29 ENCOUNTER — Other Ambulatory Visit: Payer: Self-pay

## 2020-11-29 ENCOUNTER — Encounter (HOSPITAL_COMMUNITY)
Admission: RE | Disposition: A | Discharge status: Home / Self Care | Payer: Self-pay | Source: Home / Self Care | Attending: Internal Medicine

## 2020-11-29 ENCOUNTER — Ambulatory Visit (HOSPITAL_COMMUNITY)
Admission: RE | Admit: 2020-11-29 | Discharge: 2020-11-29 | Disposition: A | Discharge status: Home / Self Care | Payer: 59 | Attending: Internal Medicine | Admitting: Internal Medicine

## 2020-11-29 DIAGNOSIS — D122 Benign neoplasm of ascending colon: Secondary | ICD-10-CM | POA: Diagnosis not present

## 2020-11-29 DIAGNOSIS — Z888 Allergy status to other drugs, medicaments and biological substances status: Secondary | ICD-10-CM | POA: Insufficient documentation

## 2020-11-29 DIAGNOSIS — K5730 Diverticulosis of large intestine without perforation or abscess without bleeding: Secondary | ICD-10-CM | POA: Insufficient documentation

## 2020-11-29 DIAGNOSIS — Z791 Long term (current) use of non-steroidal anti-inflammatories (NSAID): Secondary | ICD-10-CM | POA: Diagnosis not present

## 2020-11-29 DIAGNOSIS — Z79899 Other long term (current) drug therapy: Secondary | ICD-10-CM | POA: Diagnosis not present

## 2020-11-29 DIAGNOSIS — Z87891 Personal history of nicotine dependence: Secondary | ICD-10-CM | POA: Insufficient documentation

## 2020-11-29 DIAGNOSIS — Z853 Personal history of malignant neoplasm of breast: Secondary | ICD-10-CM | POA: Diagnosis not present

## 2020-11-29 DIAGNOSIS — Z881 Allergy status to other antibiotic agents status: Secondary | ICD-10-CM | POA: Insufficient documentation

## 2020-11-29 DIAGNOSIS — Z1211 Encounter for screening for malignant neoplasm of colon: Secondary | ICD-10-CM | POA: Diagnosis present

## 2020-11-29 HISTORY — PX: POLYPECTOMY: SHX5525

## 2020-11-29 HISTORY — PX: COLONOSCOPY WITH PROPOFOL: SHX5780

## 2020-11-29 LAB — HM COLONOSCOPY

## 2020-11-29 SURGERY — COLONOSCOPY WITH PROPOFOL
Anesthesia: General

## 2020-11-29 MED ORDER — PROPOFOL 10 MG/ML IV BOLUS
INTRAVENOUS | Status: AC
  Filled 2020-11-29: qty 40

## 2020-11-29 MED ORDER — LACTATED RINGERS IV SOLN: INTRAVENOUS | Status: DC

## 2020-11-29 MED ORDER — PROPOFOL 10 MG/ML IV BOLUS
INTRAVENOUS | Status: DC | PRN
  Administered 2020-11-29 (×2): 50 mg via INTRAVENOUS
  Administered 2020-11-29: 20 mg via INTRAVENOUS
  Administered 2020-11-29: 30 mg via INTRAVENOUS
  Administered 2020-11-29 (×2): 50 mg via INTRAVENOUS
  Administered 2020-11-29: 30 mg via INTRAVENOUS
  Administered 2020-11-29: 20 mg via INTRAVENOUS

## 2020-11-29 MED ORDER — PHENYLEPHRINE 40 MCG/ML (10ML) SYRINGE FOR IV PUSH (FOR BLOOD PRESSURE SUPPORT)
PREFILLED_SYRINGE | INTRAVENOUS | Status: AC
  Filled 2020-11-29: qty 10

## 2020-11-29 NOTE — Transfer of Care (Signed)
Immediate Anesthesia Transfer of Care Note  Patient: Kaitlyn Santana  Procedure(s) Performed: COLONOSCOPY WITH PROPOFOL POLYPECTOMY  Patient Location: Short Stay  Anesthesia Type:General  Level of Consciousness: awake  Airway & Oxygen Therapy: Patient Spontanous Breathing  Post-op Assessment: Report given to RN  Post vital signs: Reviewed and stable  Last Vitals:  Vitals Value Taken Time  BP    Temp    Pulse    Resp    SpO2      Last Pain:  Vitals:   11/29/20 1232  TempSrc:   PainSc: 0-No pain         Complications: No notable events documented.

## 2020-11-29 NOTE — Discharge Instructions (Signed)
No aspirin or NSAIDs for 24 hours. Resume other medications as before. High-fiber diet. No driving for 24 hours. Remember you cannot have an MRI until it clip has passed. Physician will call with biopsy results.

## 2020-11-29 NOTE — Anesthesia Postprocedure Evaluation (Signed)
Anesthesia Post Note  Patient: Kaitlyn Santana  Procedure(s) Performed: COLONOSCOPY WITH PROPOFOL POLYPECTOMY  Patient location during evaluation: Short Stay Anesthesia Type: General Level of consciousness: awake and alert Pain management: pain level controlled Vital Signs Assessment: post-procedure vital signs reviewed and stable Respiratory status: spontaneous breathing Cardiovascular status: blood pressure returned to baseline and stable Postop Assessment: no apparent nausea or vomiting Anesthetic complications: no   No notable events documented.   Last Vitals:  Vitals:   11/29/20 1129 11/29/20 1300  BP: 119/88   Pulse: 80 70  Resp: 16 18  Temp: 36.7 C 36.7 C  SpO2: 95% 100%    Last Pain:  Vitals:   11/29/20 1300  TempSrc: Oral  PainSc: 0-No pain                 Hanya Guerin

## 2020-11-29 NOTE — H&P (Signed)
Kaitlyn Santana is an 65 y.o. female.   Chief Complaint: Patient is here for colonoscopy. HPI: Patient is 65 year old Caucasian female who is here for screening colonoscopy.  Last exam was normal in December 2010 revealing sigmoid diverticuli and a small polyp which was not an adenoma.  She was advised to return for screening exam in 10 years.  She has no GI complaints.  Her bowels move regularly.  She denies abdominal pain rectal bleeding. Family history is negative for CRC.  Past Medical History:  Diagnosis Date   Anxiety    Arthritis    Asthma    as a child only   Bronchitis    Cancer of left breast ([place]) 08/2003   under care of Dr. Sonny Dandy   Depression    GERD (gastroesophageal reflux disease)    Headache    HTN (hypertension)    Hyperlipidemia    Irritable bowel disease    Lumbar herniated disc    Other dyspnea and respiratory abnormality    Palpitations    Rectal bleeding 08/10/2018    Past Surgical History:  Procedure Laterality Date   BREAST SURGERY     CANCER DIAG 08-2003   LUMBAR LAMINECTOMY/DECOMPRESSION MICRODISCECTOMY Right 10/31/2016   Procedure: Right Lumbar Five-Sacral One Lumbar laminotomy and microdiscectomy;  Surgeon: Jovita Gamma, MD;  Location: [facility];  Service: Neurosurgery;  Laterality: Right;  Right   TOE SURGERY     VAGINAL HYSTERECTOMY      Family History  Problem Relation Age of Onset   Anxiety disorder Mother    Depression Brother    Stroke Maternal Grandfather    Social History:  reports that she quit smoking about 12 years ago. Her smoking use included cigarettes. She has a 35.00 pack-year smoking history. She has never used smokeless tobacco. She reports current alcohol use. She reports that she does not use drugs.  Allergies:  Allergies  Allergen Reactions   Bupropion Itching   Ancef [Cefazolin Sodium] Hives and Itching   Ioxaglate Itching    Medications Prior to Admission  Medication Sig Dispense Refill   diclofenac (VOLTAREN) 75 MG EC  tablet Take 75 mg by mouth daily.     ibuprofen (ADVIL) 600 MG tablet Take 600 mg by mouth every 8 (eight) hours as needed for moderate pain.     metoprolol (TOPROL-XL) 50 MG 24 hr tablet Take 50 mg by mouth daily.       Multiple Vitamins-Minerals (CENTRUM SILVER 50+WOMEN PO) Take 1 tablet by mouth daily.     pantoprazole (PROTONIX) 40 MG tablet Take 40 mg by mouth daily.      rOPINIRole (REQUIP) 0.5 MG tablet Take 0.5 mg by mouth at bedtime.     rosuvastatin (CRESTOR) 10 MG tablet Take 10 mg by mouth daily.     solifenacin (VESICARE) 5 MG tablet TAKE 1 TABLET BY MOUTH TWICE DAILY (Patient taking differently: Take 5 mg by mouth daily.) 60 tablet 11   venlafaxine XR (EFFEXOR-XR) 150 MG 24 hr capsule Take 150 mg by mouth daily with breakfast.     venlafaxine XR (EFFEXOR-XR) 75 MG 24 hr capsule Take 75 mg by mouth daily.     clonazePAM (KLONOPIN) 0.5 MG tablet Take 1 tablet (0.5 mg total) by mouth 2 (two) times daily. (Patient taking differently: Take 0.5 mg by mouth at bedtime. ) 60 tablet 3   Na Sulfate-K Sulfate-Mg Sulf (SUPREP BOWEL PREP KIT) 17.5-3.13-1.6 GM/177ML SOLN Take 1 kit by mouth as directed. 354 mL 0  No results found for this or any previous visit (from the past 48 hour(s)). No results found.  Review of Systems  Blood pressure 119/88, pulse 80, temperature 98.1 F (36.7 C), temperature source Oral, resp. rate 16, SpO2 95 %. Physical Exam HENT:     Mouth/Throat:     Mouth: Mucous membranes are moist.     Pharynx: Oropharynx is clear.  Eyes:     General: No scleral icterus.    Conjunctiva/sclera: Conjunctivae normal.  Cardiovascular:     Rate and Rhythm: Normal rate and regular rhythm.     Heart sounds: Normal heart sounds. No murmur heard. Pulmonary:     Effort: Pulmonary effort is normal.     Breath sounds: Normal breath sounds.  Abdominal:     General: There is no distension.     Palpations: Abdomen is soft. There is no mass.     Tenderness: There is no abdominal  tenderness.  Musculoskeletal:     Cervical back: Neck supple.  Lymphadenopathy:     Cervical: No cervical adenopathy.  Neurological:     Mental Status: She is alert.     Assessment/Plan  Average rescreening colonoscopy.  Hildred Laser, MD 11/29/2020, 11:54 AM

## 2020-11-29 NOTE — Anesthesia Preprocedure Evaluation (Addendum)
Anesthesia Evaluation  Patient identified by MRN, date of birth, ID band Patient awake    Reviewed: Allergy & Precautions, NPO status , Patient's Chart, lab work & pertinent test results, reviewed documented beta blocker date and time   History of Anesthesia Complications Negative for: history of anesthetic complications  Airway Mallampati: II  TM Distance: >3 FB Neck ROM: Full    Dental  (+) Dental Advisory Given, Teeth Intact   Pulmonary asthma , former smoker,    Pulmonary exam normal breath sounds clear to auscultation       Cardiovascular Exercise Tolerance: Good hypertension, Pt. on medications and Pt. on home beta blockers Normal cardiovascular exam Rhythm:Regular Rate:Normal     Neuro/Psych  Headaches, PSYCHIATRIC DISORDERS Anxiety Depression    GI/Hepatic Neg liver ROS, GERD  Medicated and Controlled,  Endo/Other  negative endocrine ROS  Renal/GU negative Renal ROS     Musculoskeletal  (+) Arthritis  (lumbar disc herniation),   Abdominal   Peds  Hematology negative hematology ROS (+)   Anesthesia Other Findings   Reproductive/Obstetrics                            Anesthesia Physical Anesthesia Plan  ASA: 2  Anesthesia Plan: General   Post-op Pain Management:    Induction: Intravenous  PONV Risk Score and Plan: Propofol infusion  Airway Management Planned: Nasal Cannula and Natural Airway  Additional Equipment:   Intra-op Plan:   Post-operative Plan:   Informed Consent: I have reviewed the patients History and Physical, chart, labs and discussed the procedure including the risks, benefits and alternatives for the proposed anesthesia with the patient or authorized representative who has indicated his/her understanding and acceptance.     Dental advisory given  Plan Discussed with: CRNA and Surgeon  Anesthesia Plan Comments:         Anesthesia Quick  Evaluation

## 2020-11-29 NOTE — Op Note (Signed)
[facility] Patient Name: Kaitlyn Santana Procedure Date: 11/29/2020 11:40 AM MRN: [id number] Date of Birth: 06/12/1955 Attending MD: Hildred Laser , MD CSN: [id number] Age: 65 Admit Type: Outpatient Procedure:                Colonoscopy Indications:              Screening for colorectal malignant neoplasm Providers:                Hildred Laser, MD, Gwenlyn Fudge, RN, Roberta Page Referring MD:             Glenda Chroman, MD Medicines:                Propofol per Anesthesia Complications:            No immediate complications. Estimated Blood Loss:     Estimated blood loss was minimal. Procedure:                Pre-Anesthesia Assessment:                           - Prior to the procedure, a History and Physical                            was performed, and patient medications and                            allergies were reviewed. The patient's tolerance of                            previous anesthesia was also reviewed. The risks                            and benefits of the procedure and the sedation                            options and risks were discussed with the patient.                            All questions were answered, and informed consent                            was obtained. Prior Anticoagulants: The patient has                            taken no previous anticoagulant or antiplatelet                            agents except for NSAID medication. ASA Grade                            Assessment: II - A patient with mild systemic                            disease. After reviewing the risks and benefits,  the patient was deemed in satisfactory condition to                            undergo the procedure.                           After obtaining informed consent, the colonoscope                            was passed under direct vision. Throughout the                            procedure, the patient's blood pressure, pulse, and                             oxygen saturations were monitored continuously. The                            PCF-[id number] ([id number]) was introduced through the                            anus and advanced to the the cecum, identified by                            appendiceal orifice and ileocecal valve. The                            colonoscopy was performed without difficulty. The                            patient tolerated the procedure well. The quality                            of the bowel preparation was adequate to identify                            polyps. The appendiceal orifice, and rectum were                            photographed. Scope In: 12:35:59 PM Scope Out: 12:55:03 PM Scope Withdrawal Time: 0 hours 14 minutes 41 seconds  Total Procedure Duration: 0 hours 19 minutes 4 seconds  Findings:      The perianal and digital rectal examinations were normal.      Multiple small and large-mouthed diverticula were found in the entire       colon.      A 7 mm polyp was found in the mid ascending colon. The polyp was removed       with a cold snare. Resection and retrieval were complete. To stop active       bleeding, one hemostatic clip was successfully placed (MR conditional).       There was no bleeding at the end of the procedure.      The retroflexed view of the distal rectum and anal verge was normal and       showed no anal or rectal  abnormalities. Impression:               - Diverticulosis in the entire examined colon.                           - One 7 mm polyp in the mid ascending colon,                            removed with a cold snare. Resected and retrieved.                            Clip (MR conditional) was placed. Moderate Sedation:      Per Anesthesia Care Recommendation:           - Patient has a contact number available for                            emergencies. The signs and symptoms of potential                            delayed complications were discussed with  the                            patient. Return to normal activities tomorrow.                            Written discharge instructions were provided to the                            patient.                           - High fiber diet today.                           - Continue present medications.                           - No aspirin, ibuprofen, naproxen, or other                            non-steroidal anti-inflammatory drugs for 1 day.                           - Await pathology results.                           - Repeat colonoscopy is recommended. The                            colonoscopy date will be determined after pathology                            results from today's exam become available for  review. Procedure Code(s):        --- Professional ---                           [phone], Colonoscopy, flexible; with removal of                            tumor(s), polyp(s), or other lesion(s) by snare                            technique Diagnosis Code(s):        --- Professional ---                           Z12.11, Encounter for screening for malignant                            neoplasm of colon                           K63.5, Polyp of colon                           K57.30, Diverticulosis of large intestine without                            perforation or abscess without bleeding CPT copyright 2019 American Medical Association. All rights reserved. The codes documented in this report are preliminary and upon coder review may  be revised to meet current compliance requirements. Hildred Laser, MD Hildred Laser, MD 11/29/2020 1:07:35 PM This report has been signed electronically. Number of Addenda: 0

## 2020-12-01 LAB — SURGICAL PATHOLOGY

## 2020-12-04 ENCOUNTER — Encounter (INDEPENDENT_AMBULATORY_CARE_PROVIDER_SITE_OTHER): Payer: Self-pay | Admitting: *Deleted

## 2020-12-05 ENCOUNTER — Encounter (HOSPITAL_COMMUNITY): Payer: Self-pay | Admitting: Internal Medicine

## 2021-05-23 ENCOUNTER — Other Ambulatory Visit: Payer: Self-pay | Admitting: Urology

## 2021-05-24 NOTE — Telephone Encounter (Signed)
Pt has appt 1/6 to discuss medication and long term refills

## 2021-06-13 ENCOUNTER — Ambulatory Visit: Payer: 59 | Admitting: Urology

## 2021-07-24 ENCOUNTER — Other Ambulatory Visit: Payer: Self-pay | Admitting: Physician Assistant

## 2021-07-24 ENCOUNTER — Ambulatory Visit: Payer: 59 | Admitting: Urology

## 2021-08-25 ENCOUNTER — Other Ambulatory Visit: Payer: Self-pay | Admitting: Physician Assistant

## 2021-08-27 ENCOUNTER — Other Ambulatory Visit: Payer: Self-pay

## 2021-08-27 ENCOUNTER — Ambulatory Visit: Payer: 59 | Admitting: Urology

## 2021-08-27 ENCOUNTER — Encounter: Payer: Self-pay | Admitting: Urology

## 2021-08-27 VITALS — BP 145/93 | HR 91

## 2021-08-27 DIAGNOSIS — R310 Gross hematuria: Secondary | ICD-10-CM

## 2021-08-27 DIAGNOSIS — N3941 Urge incontinence: Secondary | ICD-10-CM

## 2021-08-27 LAB — URINALYSIS, ROUTINE W REFLEX MICROSCOPIC
Bilirubin, UA: NEGATIVE
Glucose, UA: NEGATIVE
Ketones, UA: NEGATIVE
Leukocytes,UA: NEGATIVE
Nitrite, UA: NEGATIVE
Protein,UA: NEGATIVE
Specific Gravity, UA: 1.015 (ref 1.005–1.030)
Urobilinogen, Ur: 0.2 mg/dL (ref 0.2–1.0)
pH, UA: 6 (ref 5.0–7.5)

## 2021-08-27 LAB — MICROSCOPIC EXAMINATION: Renal Epithel, UA: NONE SEEN /hpf

## 2021-08-27 MED ORDER — FLUCONAZOLE 150 MG PO TABS: 150.0000 mg | ORAL_TABLET | Freq: Once | ORAL | 6 refills | Status: AC

## 2021-08-27 MED ORDER — SOLIFENACIN SUCCINATE 5 MG PO TABS: 5.0000 mg | ORAL_TABLET | Freq: Two times a day (BID) | ORAL | 11 refills | Status: DC

## 2021-08-27 NOTE — Addendum Note (Signed)
Addended by: Cleon Gustin on: 08/27/2021 11:53 AM ? ? Modules accepted: Orders ? ?

## 2021-08-27 NOTE — Progress Notes (Addendum)
? ?08/27/2021 ?11:41 AM  ? ?[place] ?1955/12/22 ?[id number] ? ?Referring provider: Glenda Chroman, MD ?[address] ?[place],  [place] [address] ? ?Followup urge incontinence and gross hematuria ? ? ?HPI: ?Kaitlyn Santana is a 66yo here for followup for urge incontinence. She has rare urge incontinence on vesicare 34m BID. She uses 1-2 pads per dayNo urinary frequency or urgency. She has stopped her effexor since last visit. She has retired since las visit and now spends more time with her granddaughter. No gross hematuria since last visit. No other complaints today.  ? ? ?PMH: ?Past Medical History:  ?Diagnosis Date  ? Anxiety   ? Arthritis   ? Asthma   ? as a child only  ? Bronchitis   ? Cancer of left breast ([facility] 08/2003  ? under care of Dr. KSonny Dandy ? Depression   ? GERD (gastroesophageal reflux disease)   ? Headache   ? HTN (hypertension)   ? Hyperlipidemia   ? Irritable bowel disease   ? Lumbar herniated disc   ? Other dyspnea and respiratory abnormality   ? Palpitations   ? Rectal bleeding 08/10/2018  ? ? ?Surgical History: ?Past Surgical History:  ?Procedure Laterality Date  ? BREAST SURGERY    ? CANCER DIAG 08-2003  ? COLONOSCOPY WITH PROPOFOL N/A 11/29/2020  ? Procedure: COLONOSCOPY WITH PROPOFOL;  Surgeon: RRogene Houston MD;  [facility];  Service: Endoscopy;  Laterality: N/A;  12:50  ? LUMBAR LAMINECTOMY/DECOMPRESSION MICRODISCECTOMY Right 10/31/2016  ? Procedure: Right Lumbar Five-Sacral One Lumbar laminotomy and microdiscectomy;  Surgeon: NJovita Gamma MD;  [facility] [facility]  Service: Neurosurgery;  Laterality: Right;  Right  ? POLYPECTOMY  11/29/2020  ? Procedure: POLYPECTOMY;  Surgeon: RRogene Houston MD;  [facility];  Service: Endoscopy;;  ? TOE SURGERY    ? VAGINAL HYSTERECTOMY    ? ? ?Home Medications:  ?Allergies as of 08/27/2021   ? ?   Reactions  ? Bupropion Itching  ? Ancef [cefazolin Sodium] Hives, Itching  ? Ioxaglate Itching  ? ?  ? ?  ?Medication List  ?  ? ?  ? Accurate as of  August 27, 2021 11:41 AM. If you have any questions, ask your nurse or doctor.  ?  ?  ? ?  ? ?STOP taking these medications   ? ?clonazePAM 0.5 MG tablet ?Commonly known as: KlonoPIN ?Stopped by: PNicolette Bang MD ?  ?venlafaxine XR 150 MG 24 hr capsule ?Commonly known as: EFFEXOR-XR ?Stopped by: PNicolette Bang MD ?  ?venlafaxine XR 75 MG 24 hr capsule ?Commonly known as: EFFEXOR-XR ?Stopped by: PNicolette Bang MD ?  ? ?  ? ?TAKE these medications   ? ?CENTRUM SILVER 50+WOMEN PO ?Take 1 tablet by mouth daily. ?  ?diclofenac 75 MG EC tablet ?Commonly known as: VOLTAREN ?Take 75 mg by mouth daily. ?  ?metoprolol succinate 50 MG 24 hr tablet ?Commonly known as: TOPROL-XL ?Take 50 mg by mouth daily. ?  ?pantoprazole 40 MG tablet ?Commonly known as: PROTONIX ?Take 40 mg by mouth daily. ?  ?rOPINIRole 0.5 MG tablet ?Commonly known as: REQUIP ?Take 0.5 mg by mouth at bedtime. ?  ?rosuvastatin 10 MG tablet ?Commonly known as: CRESTOR ?Take 10 mg by mouth daily. ?  ?solifenacin 5 MG tablet ?Commonly known as: VESICARE ?TAKE ONE TABLET BY MOUTH TWICE DAILY ?  ?Suprep Bowel Prep Kit 17.5-3.13-1.6 GM/177ML Soln ?Generic drug: Na Sulfate-K Sulfate-Mg Sulf ?Take 1 kit by mouth as directed. ?  ? ?  ? ? ?  Allergies:  ?Allergies  ?Allergen Reactions  ? Bupropion Itching  ? Ancef [Cefazolin Sodium] Hives and Itching  ? Ioxaglate Itching  ? ? ?Family History: ?Family History  ?Problem Relation Age of Onset  ? Anxiety disorder Mother   ? Depression Brother   ? Stroke Maternal Grandfather   ? ? ?Social History:  reports that she quit smoking about 13 years ago. Her smoking use included cigarettes. She has a 35.00 pack-year smoking history. She has never used smokeless tobacco. She reports current alcohol use. She reports that she does not use drugs. ? ?ROS: ?All other review of systems were reviewed and are negative except what is noted above in HPI ? ?Physical Exam: ?BP (!) 145/93   Pulse 91   ?Constitutional:  Alert and  oriented, No acute distress. ?HEENT: [place] AT, moist mucus membranes.  Trachea midline, no masses. ?Cardiovascular: No clubbing, cyanosis, or edema. ?Respiratory: Normal respiratory effort, no increased work of breathing. ?GI: Abdomen is soft, nontender, nondistended, no abdominal masses ?GU: No CVA tenderness.  ?Lymph: No cervical or inguinal lymphadenopathy. ?Skin: No rashes, bruises or suspicious lesions. ?Neurologic: Grossly intact, no focal deficits, moving all 4 extremities. ?Psychiatric: Normal mood and affect. ? ?Laboratory Data: ?Lab Results  ?Component Value Date  ? WBC 9.2 10/03/2020  ? HGB 13.3 10/03/2020  ? HCT 41.8 10/03/2020  ? MCV 92.1 10/03/2020  ? PLT 266 10/03/2020  ? ? ?Lab Results  ?Component Value Date  ? CREATININE 0.49 10/03/2020  ? ? ?No results found for: PSA ? ?No results found for: TESTOSTERONE ? ?No results found for: HGBA1C ? ?Urinalysis ?   ?Component Value Date/Time  ? BILIRUBINUR 1+ 06/14/2020 1333  ? PROTEINUR Negative 06/14/2020 1333  ? UROBILINOGEN 0.2 06/14/2020 1333  ? NITRITE neg 06/14/2020 1333  ? LEUKOCYTESUR Negative 06/14/2020 1333  ? ? ?No results found for: LABMICR, [place], RBCUA, LABEPIT, MUCUS, BACTERIA ? ?Pertinent Imaging: ? ?No results found for this or any previous visit. ? ?No results found for this or any previous visit. ? ?No results found for this or any previous visit. ? ?No results found for this or any previous visit. ? ?No results found for this or any previous visit. ? ?No results found for this or any previous visit. ? ?No results found for this or any previous visit. ? ?No results found for this or any previous visit. ? ? ?Assessment & Plan:   ? ?1. Urge incontinence ?-Continue vesicare 85m BID ?- Urinalysis, Routine w reflex microscopic ? ?2. Gross hematuria ?-RTC 1 year with UA ? ? ?No follow-ups on file. ? ?PNicolette Bang MD ? ?[facility]?  ?

## 2021-08-27 NOTE — Patient Instructions (Signed)

## 2021-10-18 DIAGNOSIS — W19XXXA Unspecified fall, initial encounter: Secondary | ICD-10-CM | POA: Diagnosis not present

## 2021-10-18 DIAGNOSIS — M25561 Pain in right knee: Secondary | ICD-10-CM | POA: Diagnosis not present

## 2021-10-18 DIAGNOSIS — M25562 Pain in left knee: Secondary | ICD-10-CM | POA: Diagnosis not present

## 2021-10-26 DIAGNOSIS — Z0000 Encounter for general adult medical examination without abnormal findings: Secondary | ICD-10-CM | POA: Diagnosis not present

## 2021-10-26 DIAGNOSIS — Z299 Encounter for prophylactic measures, unspecified: Secondary | ICD-10-CM | POA: Diagnosis not present

## 2021-10-26 DIAGNOSIS — Z1339 Encounter for screening examination for other mental health and behavioral disorders: Secondary | ICD-10-CM | POA: Diagnosis not present

## 2021-10-26 DIAGNOSIS — Z1331 Encounter for screening for depression: Secondary | ICD-10-CM | POA: Diagnosis not present

## 2021-10-26 DIAGNOSIS — Z79899 Other long term (current) drug therapy: Secondary | ICD-10-CM | POA: Diagnosis not present

## 2021-10-26 DIAGNOSIS — R5383 Other fatigue: Secondary | ICD-10-CM | POA: Diagnosis not present

## 2021-10-26 DIAGNOSIS — I10 Essential (primary) hypertension: Secondary | ICD-10-CM | POA: Diagnosis not present

## 2021-10-26 DIAGNOSIS — E7800 Pure hypercholesterolemia, unspecified: Secondary | ICD-10-CM | POA: Diagnosis not present

## 2021-10-26 DIAGNOSIS — Z7189 Other specified counseling: Secondary | ICD-10-CM | POA: Diagnosis not present

## 2021-10-26 DIAGNOSIS — Z6831 Body mass index (BMI) 31.0-31.9, adult: Secondary | ICD-10-CM | POA: Diagnosis not present

## 2021-11-23 DIAGNOSIS — Z1231 Encounter for screening mammogram for malignant neoplasm of breast: Secondary | ICD-10-CM | POA: Diagnosis not present

## 2021-11-26 DIAGNOSIS — I10 Essential (primary) hypertension: Secondary | ICD-10-CM | POA: Diagnosis not present

## 2021-11-26 DIAGNOSIS — R739 Hyperglycemia, unspecified: Secondary | ICD-10-CM | POA: Diagnosis not present

## 2021-11-26 DIAGNOSIS — Z299 Encounter for prophylactic measures, unspecified: Secondary | ICD-10-CM | POA: Diagnosis not present

## 2021-11-26 DIAGNOSIS — R7303 Prediabetes: Secondary | ICD-10-CM | POA: Diagnosis not present

## 2021-11-29 ENCOUNTER — Other Ambulatory Visit: Payer: Self-pay | Admitting: Student

## 2021-11-29 DIAGNOSIS — R921 Mammographic calcification found on diagnostic imaging of breast: Secondary | ICD-10-CM | POA: Diagnosis not present

## 2021-11-29 DIAGNOSIS — R928 Other abnormal and inconclusive findings on diagnostic imaging of breast: Secondary | ICD-10-CM | POA: Diagnosis not present

## 2021-11-29 DIAGNOSIS — N6323 Unspecified lump in the left breast, lower outer quadrant: Secondary | ICD-10-CM | POA: Diagnosis not present

## 2021-11-30 ENCOUNTER — Ambulatory Visit
Admission: RE | Admit: 2021-11-30 | Discharge: 2021-11-30 | Disposition: A | Discharge status: Home / Self Care | Payer: 59 | Source: Ambulatory Visit | Attending: Student | Admitting: Student

## 2021-11-30 DIAGNOSIS — C50412 Malignant neoplasm of upper-outer quadrant of left female breast: Secondary | ICD-10-CM | POA: Diagnosis not present

## 2021-11-30 DIAGNOSIS — R921 Mammographic calcification found on diagnostic imaging of breast: Secondary | ICD-10-CM

## 2021-12-04 ENCOUNTER — Other Ambulatory Visit: Payer: Self-pay | Admitting: Surgery

## 2021-12-04 DIAGNOSIS — C50912 Malignant neoplasm of unspecified site of left female breast: Secondary | ICD-10-CM | POA: Diagnosis not present

## 2021-12-07 DIAGNOSIS — Z299 Encounter for prophylactic measures, unspecified: Secondary | ICD-10-CM | POA: Diagnosis not present

## 2021-12-07 DIAGNOSIS — M7989 Other specified soft tissue disorders: Secondary | ICD-10-CM | POA: Diagnosis not present

## 2021-12-07 DIAGNOSIS — I10 Essential (primary) hypertension: Secondary | ICD-10-CM | POA: Diagnosis not present

## 2021-12-07 DIAGNOSIS — E2839 Other primary ovarian failure: Secondary | ICD-10-CM | POA: Diagnosis not present

## 2021-12-07 DIAGNOSIS — M25572 Pain in left ankle and joints of left foot: Secondary | ICD-10-CM | POA: Diagnosis not present

## 2021-12-07 DIAGNOSIS — M19072 Primary osteoarthritis, left ankle and foot: Secondary | ICD-10-CM | POA: Diagnosis not present

## 2021-12-12 ENCOUNTER — Telehealth: Payer: Self-pay | Admitting: Hematology and Oncology

## 2021-12-12 NOTE — Telephone Encounter (Signed)
Pt called back and confirmed appt on 7/11 with Dr. Lindi Adie.

## 2021-12-12 NOTE — Telephone Encounter (Signed)
Scheduled appt per 7/5 referral. Called pt, no answer. Left msg with appt date/time. Requested for pt to call back to confirm appt. Will try to call pt again tomorrow.

## 2021-12-12 NOTE — Progress Notes (Signed)
New Breast Cancer Diagnosis: Left Breast UOQ  Did patient present with symptoms (if so, please note symptoms) or screening mammography?:Screening Calcifications    Location and Extent of disease :left breast. Located in the upper outer quadrant, measured  1.8 cm in greatest dimension. Adenopathy no.  Histology per Pathology Report: grade 2, Invasive Ductal Carcinoma and DCIS 12/03/2021  Receptor Status: ER(negative), PR (negative), Her2-neu (positive), Ki-(5%)  Surgeon and surgical plan, if any:  Dr. Ninfa Linden 12/04/2021 -Given her previous history of left breast cancer with a lumpectomy and radiation therapy, she may not be a candidate for breast conservation.  -I will refer her preoperatively to medical and radiation oncology for further discussion as well.  -We will then discuss whether proceed with a lumpectomy and sentinel node biopsy versus a mastectomy.  -She is currently very anxious and still uncertain which direction she would like to proceed with.   Medical oncologist, treatment if any:   Dr. Lindi Adie 12/18/2021   Family History of Breast/Ovarian/Prostate Cancer: Paternal Grandmother had breast cancer  Lymphedema issues, if any: No     Pain issues, if any: No     SAFETY ISSUES: Prior radiation? Left Breast 2005, Lumpectomy and XRT Pacemaker/ICD? No Possible current pregnancy? Hysterectomy Is the patient on methotrexate? No  Current Complaints / other details:   -Previous left breast cancer, treated with lumpectomy, postoperative radiation and tamoxifen for 5 years.

## 2021-12-13 ENCOUNTER — Ambulatory Visit
Admission: RE | Admit: 2021-12-13 | Discharge: 2021-12-13 | Disposition: A | Discharge status: Home / Self Care | Payer: PPO | Source: Ambulatory Visit | Attending: Radiation Oncology | Admitting: Radiation Oncology

## 2021-12-13 ENCOUNTER — Other Ambulatory Visit: Payer: Self-pay

## 2021-12-13 ENCOUNTER — Encounter: Payer: Self-pay | Admitting: Radiation Oncology

## 2021-12-13 VITALS — BP 152/87 | HR 70 | Temp 98.2°F | Resp 18 | Ht 66.5 in | Wt 194.8 lb

## 2021-12-13 DIAGNOSIS — F419 Anxiety disorder, unspecified: Secondary | ICD-10-CM | POA: Diagnosis not present

## 2021-12-13 DIAGNOSIS — D0512 Intraductal carcinoma in situ of left breast: Secondary | ICD-10-CM

## 2021-12-13 DIAGNOSIS — Z791 Long term (current) use of non-steroidal anti-inflammatories (NSAID): Secondary | ICD-10-CM | POA: Diagnosis not present

## 2021-12-13 DIAGNOSIS — Z170 Estrogen receptor positive status [ER+]: Secondary | ICD-10-CM | POA: Insufficient documentation

## 2021-12-13 DIAGNOSIS — K219 Gastro-esophageal reflux disease without esophagitis: Secondary | ICD-10-CM | POA: Diagnosis not present

## 2021-12-13 DIAGNOSIS — Z803 Family history of malignant neoplasm of breast: Secondary | ICD-10-CM | POA: Diagnosis not present

## 2021-12-13 DIAGNOSIS — I10 Essential (primary) hypertension: Secondary | ICD-10-CM | POA: Insufficient documentation

## 2021-12-13 DIAGNOSIS — Z87891 Personal history of nicotine dependence: Secondary | ICD-10-CM | POA: Diagnosis not present

## 2021-12-13 DIAGNOSIS — Z79899 Other long term (current) drug therapy: Secondary | ICD-10-CM | POA: Diagnosis not present

## 2021-12-13 DIAGNOSIS — J45909 Unspecified asthma, uncomplicated: Secondary | ICD-10-CM | POA: Insufficient documentation

## 2021-12-13 DIAGNOSIS — C50412 Malignant neoplasm of upper-outer quadrant of left female breast: Secondary | ICD-10-CM

## 2021-12-13 DIAGNOSIS — C50912 Malignant neoplasm of unspecified site of left female breast: Secondary | ICD-10-CM | POA: Diagnosis not present

## 2021-12-13 DIAGNOSIS — Z171 Estrogen receptor negative status [ER-]: Secondary | ICD-10-CM | POA: Insufficient documentation

## 2021-12-13 DIAGNOSIS — M129 Arthropathy, unspecified: Secondary | ICD-10-CM | POA: Insufficient documentation

## 2021-12-13 DIAGNOSIS — E785 Hyperlipidemia, unspecified: Secondary | ICD-10-CM | POA: Insufficient documentation

## 2021-12-13 DIAGNOSIS — C50419 Malignant neoplasm of upper-outer quadrant of unspecified female breast: Secondary | ICD-10-CM | POA: Insufficient documentation

## 2021-12-13 NOTE — Progress Notes (Signed)
Radiation Oncology         [phone] ________________________________  Name: Kaitlyn Santana MRN: [id number]  Date: 12/13/2021  DOB: 12/09/1955  QP:[id number], Costella Hatcher, MD  Coralie Keens, MD     REFERRING PHYSICIAN: Coralie Keens, MD   DIAGNOSIS: IDC of the left breast   HISTORY OF PRESENT ILLNESS::Kaitlyn Santana is a 66 y.o. female who is seen for an initial consultation visit regarding the patient's diagnosis of left-sided breast cancer.  The patient was found recently to have an abnormality within the left breast.  Further work-up revealed a 1.8 cm area of calcifications which were suspicious within the upper outer quadrant of the left breast.  Biopsy revealed invasive ductal carcinoma, grade 2, which was associated with DCIS.  Notably, the patient did have a previous left-sided breast cancer for which she underwent lumpectomy and adjuvant radiation treatment in 2005.  I do not have any further details at this time but the patient does confirm this treatment.  I have been asked to see the patient for consideration of radiation treatment currently.    PREVIOUS RADIATION THERAPY: Yes adjuvant radiation treatment to the left breast status postlumpectomy in 2005 for breast cancer   PAST MEDICAL HISTORY:  has a past medical history of Anxiety, Arthritis, Asthma, Bronchitis, Cancer of left breast ([place]) (08/2003), Depression, GERD (gastroesophageal reflux disease), Headache, HTN (hypertension), Hyperlipidemia, Irritable bowel disease, Lumbar herniated disc, Other dyspnea and respiratory abnormality, Palpitations, and Rectal bleeding (08/10/2018).     PAST SURGICAL HISTORY: Past Surgical History:  Procedure Laterality Date   BREAST SURGERY     CANCER DIAG 08-2003   COLONOSCOPY WITH PROPOFOL N/A 11/29/2020   Procedure: COLONOSCOPY WITH PROPOFOL;  Surgeon: Rogene Houston, MD;  [facility];  Service: Endoscopy;  Laterality: N/A;  12:50   LUMBAR LAMINECTOMY/DECOMPRESSION  MICRODISCECTOMY Right 10/31/2016   Procedure: Right Lumbar Five-Sacral One Lumbar laminotomy and microdiscectomy;  Surgeon: Jovita Gamma, MD;  Location: [facility];  Service: Neurosurgery;  Laterality: Right;  Right   POLYPECTOMY  11/29/2020   Procedure: POLYPECTOMY;  Surgeon: Rogene Houston, MD;  [facility];  Service: Endoscopy;;   TOE SURGERY     VAGINAL HYSTERECTOMY       FAMILY HISTORY: family history includes Anxiety disorder in her mother; Breast cancer in her paternal grandmother; Depression in her brother; Stroke in her maternal grandfather.   SOCIAL HISTORY:  reports that she quit smoking about 13 years ago. Her smoking use included cigarettes. She has a 35.00 pack-year smoking history. She has never used smokeless tobacco. She reports current alcohol use. She reports that she does not use drugs.   ALLERGIES: Bupropion, Ancef [cefazolin sodium], and Ioxaglate   MEDICATIONS:  Current Outpatient Medications  Medication Sig Dispense Refill   diclofenac (VOLTAREN) 75 MG EC tablet Take 75 mg by mouth daily.     metoprolol (TOPROL-XL) 50 MG 24 hr tablet Take 50 mg by mouth daily.       Multiple Vitamins-Minerals (CENTRUM SILVER 50+WOMEN PO) Take 1 tablet by mouth daily.     pantoprazole (PROTONIX) 40 MG tablet Take 40 mg by mouth daily.      rOPINIRole (REQUIP) 0.5 MG tablet Take 0.5 mg by mouth at bedtime.     rosuvastatin (CRESTOR) 10 MG tablet Take 10 mg by mouth daily.     solifenacin (VESICARE) 5 MG tablet Take 1 tablet (5 mg total) by mouth 2 (two) times daily. 60 tablet 11   Na Sulfate-K  Sulfate-Mg Sulf (SUPREP BOWEL PREP KIT) 17.5-3.13-1.6 GM/177ML SOLN Take 1 kit by mouth as directed. (Patient not taking: Reported on 12/13/2021) 354 mL 0   No current facility-administered medications for this encounter.     REVIEW OF SYSTEMS:  A 15 point review of systems is documented in the electronic medical record. This was obtained by the nursing staff. However, I reviewed  this with the patient to discuss relevant findings and make appropriate changes.  Pertinent items are noted in HPI.    PHYSICAL EXAM:  height is 5' 6.5" (1.689 m) and weight is 194 lb 12.8 oz (88.4 kg). Her temperature is 98.2 F (36.8 C). Her blood pressure is 152/87 (abnormal) and her pulse is 70. Her respiration is 18 and oxygen saturation is 96%.   ECOG = 0  0 - Asymptomatic (Fully active, able to carry on all predisease activities without restriction)  1 - Symptomatic but completely ambulatory (Restricted in physically strenuous activity but ambulatory and able to carry out work of a light or sedentary nature. For example, light housework, office work)  2 - Symptomatic, <50% in bed during the day (Ambulatory and capable of all self care but unable to carry out any work activities. Up and about more than 50% of waking hours)  3 - Symptomatic, >50% in bed, but not bedbound (Capable of only limited self-care, confined to bed or chair 50% or more of waking hours)  4 - Bedbound (Completely disabled. Cannot carry on any self-care. Totally confined to bed or chair)  5 - Death   Eustace Pen MM, Creech RH, Tormey DC, et al. [phone]). "Toxicity and response criteria of the [facility]". [place] Oncol. 5 (6): 649-55  Alert, no acute distress   LABORATORY DATA:  Lab Results  Component Value Date   WBC 9.2 10/03/2020   HGB 13.3 10/03/2020   HCT 41.8 10/03/2020   MCV 92.1 10/03/2020   PLT 266 10/03/2020   Lab Results  Component Value Date   NA 136 10/03/2020   K 4.2 10/03/2020   CL 102 10/03/2020   CO2 27 10/03/2020   No results found for: "ALT", "AST", "GGT", "ALKPHOS", "BILITOT"    RADIOGRAPHY: MM LT BREAST BX W LOC DEV 1ST LESION IMAGE BX SPEC STEREO GUIDE  Addendum Date: 12/05/2021   ADDENDUM REPORT: 12/05/2021 08:52 ADDENDUM: Pathology revealed GRADE 2 INVASIVE DUCTAL CARCINOMA WITH APOCRINE FEATURES, DUCTAL CARCINOMA IN SITU WITH NECROSIS AND  CALCIFICATIONS, APOCRINE, HIGH-GRADE GRADE. This was found to be concordant by Dr. Lovey Newcomer. Pathology results were discussed with the patient by telephone. The patient reported doing well after the biopsy with tenderness at the site. Post biopsy instructions and care were reviewed and questions were answered. The patient was encouraged to call [facility] for any additional concerns. Breast MRI is recommended to evaluate for extent of disease. Surgical consultation has been arranged with Dr. Nedra Hai at [facility] on December 04, 2021. Pathology results reported by Stacie Acres RN on 12/03/2021. Electronically Signed   By: Lovey Newcomer M.D.   On: 12/05/2021 08:52   Result Date: 12/05/2021 CLINICAL DATA:  Patient with indeterminate left breast calcifications. EXAM: LEFT BREAST STEREOTACTIC CORE NEEDLE BIOPSY COMPARISON:  [facility] FINDINGS: The patient and I discussed the procedure of stereotactic-guided biopsy including benefits and alternatives. We discussed the high likelihood of a successful procedure. We discussed the risks of the procedure including infection, bleeding, tissue injury, clip migration, and inadequate sampling. Informed  written consent was given. The usual time out protocol was performed immediately prior to the procedure. Using sterile technique and 1% Lidocaine as local anesthetic, under stereotactic guidance, a 9 gauge vacuum assisted device was used to perform core needle biopsy of calcifications upper-outer left breast using a lateral approach. Specimen radiograph was performed showing calcification. Specimens with calcifications are identified for pathology. Lesion quadrant: Upper outer quadrant At the conclusion of the procedure, X shaped tissue marker clip was deployed into the biopsy cavity. Follow-up 2-view mammogram was performed and dictated separately. IMPRESSION: Stereotactic-guided biopsy of left breast calcifications. No apparent  complications. Electronically Signed: By: Lovey Newcomer M.D. On: 11/30/2021 11:39  MM CLIP PLACEMENT LEFT  Result Date: 11/30/2021 CLINICAL DATA:  Status post stereo biopsy left breast calcifications. EXAM: 3D DIAGNOSTIC LEFT MAMMOGRAM POST STEREOTACTIC BIOPSY COMPARISON:  Previous exam(s). FINDINGS: 3D Mammographic images were obtained following stereotactic guided biopsy of left breast calcifications. The biopsy marking clip is in expected position at the site of biopsy. IMPRESSION: Appropriate positioning of the X shaped biopsy marking clip at the site of biopsy in the upper-outer left breast. Final Assessment: Post Procedure Mammograms for Marker Placement Electronically Signed   By: Lovey Newcomer M.D.   On: 11/30/2021 11:41      IMPRESSION/ PLAN:  The patient has a new diagnosis of invasive ductal carcinoma of the left breast.  This appears to represent a clinical T1 cN0 M0 tumor.  Notably, the patient does have a history of left-sided breast cancer in 2005 for which she underwent lumpectomy and adjuvant radiation treatment.  I discussed the issue of reirradiation within the setting including the potential risk for increased side effects.  We did discuss that there is some data that suggest that in individualized cases this can be completed successfully but in general we do like to avoid reirradiation.  The patient states that she is comfortable with mastectomy and at this time would like to proceed with this.  Part of the reason is her feeling about the cosmetic outcome of having a second lumpectomy versus going ahead and completing a mastectomy potentially with plastic surgery.  I believe this is reasonable especially given her prior history and she and I discussed that this is often a standard pathway after recurrence.  If she is comfortable with this as she states today, I believe this is a good treatment plan and I look forward to reviewing her final pathology but at this time do not anticipate any  need for radiation treatment.  The patient was seen in person today in clinic.  The total time spent on the patient's visit today was 45 minutes, including chart review, direct discussion/evaluation with the patient, and coordination of care.        ________________________________   Jodelle Gross, MD, PhD   **Disclaimer: This note was dictated with voice recognition software. Similar sounding words can inadvertently be transcribed and this note may contain transcription errors which may not have been corrected upon publication of note.**

## 2021-12-18 ENCOUNTER — Other Ambulatory Visit: Payer: Self-pay

## 2021-12-18 ENCOUNTER — Inpatient Hospital Stay: Payer: PPO | Attending: Hematology and Oncology | Admitting: Hematology and Oncology

## 2021-12-18 DIAGNOSIS — Z171 Estrogen receptor negative status [ER-]: Secondary | ICD-10-CM | POA: Diagnosis not present

## 2021-12-18 DIAGNOSIS — C50412 Malignant neoplasm of upper-outer quadrant of left female breast: Secondary | ICD-10-CM

## 2021-12-18 DIAGNOSIS — Z9223 Personal history of estrogen therapy: Secondary | ICD-10-CM | POA: Diagnosis not present

## 2021-12-18 DIAGNOSIS — Z923 Personal history of irradiation: Secondary | ICD-10-CM | POA: Diagnosis not present

## 2021-12-18 DIAGNOSIS — Z853 Personal history of malignant neoplasm of breast: Secondary | ICD-10-CM | POA: Diagnosis not present

## 2021-12-18 NOTE — Progress Notes (Signed)
[facility] CONSULT NOTE  Patient Care Team: Ignatius Specking, MD as PCP - General (Internal Medicine)  CHIEF COMPLAINTS/PURPOSE OF CONSULTATION:  Newly diagnosed breast cancer left breast cancer  HISTORY OF PRESENTING ILLNESS:  Kaitlyn Santana 66 y.o. female is here because of recent diagnosis of left breast cancer. She presents to the clinic today for a consult.  She had a routine screening mammogram that detected left breast cancer.  She had a prior history of left breast cancer in 2005 that was treated with lumpectomy radiation and 5 years of tamoxifen.  Biopsy of the current breast cancer came back as grade 2 IDC with apocrine features ER/PR negative HER2 positive with a Ki-67 of 5%.  I reviewed her records extensively and collaborated the history with the patient.  SUMMARY OF ONCOLOGIC HISTORY: Oncology History  Malignant neoplasm of upper-outer quadrant of left breast in female, estrogen receptor negative (HCC)  12/13/2021 Initial Diagnosis   Left breast biopsy UOQ: Grade 2 IDC with apocrine features, with high-grade DCIS, ER 0%, PR 0%, HER2 3+ positive, Ki-67 5%      MEDICAL HISTORY:  Past Medical History:  Diagnosis Date   Anxiety    Arthritis    Asthma    as a child only   Bronchitis    Cancer of left breast (HCC) 08/2003   under care of Dr. Cleone Slim   Depression    GERD (gastroesophageal reflux disease)    Headache    HTN (hypertension)    Hyperlipidemia    Irritable bowel disease    Lumbar herniated disc    Other dyspnea and respiratory abnormality    Palpitations    Rectal bleeding 08/10/2018    SURGICAL HISTORY: Past Surgical History:  Procedure Laterality Date   BREAST SURGERY     CANCER DIAG 08-2003   COLONOSCOPY WITH PROPOFOL N/A 11/29/2020   Procedure: COLONOSCOPY WITH PROPOFOL;  Surgeon: Malissa Hippo, MD;  [facility];  Service: Endoscopy;  Laterality: N/A;  12:50   LUMBAR LAMINECTOMY/DECOMPRESSION MICRODISCECTOMY Right 10/31/2016    Procedure: Right Lumbar Five-Sacral One Lumbar laminotomy and microdiscectomy;  Surgeon: Shirlean Kelly, MD;  Location: [facility] OR;  Service: Neurosurgery;  Laterality: Right;  Right   POLYPECTOMY  11/29/2020   Procedure: POLYPECTOMY;  Surgeon: Malissa Hippo, MD;  [facility];  Service: Endoscopy;;   TOE SURGERY     VAGINAL HYSTERECTOMY      SOCIAL HISTORY: Social History   Socioeconomic History   Marital status: Married    Spouse name: RONALD   Number of children: 1   Years of education: Not on file   Highest education level: Not on file  Occupational History   Occupation: [employer]    Employer: [employer]  Tobacco Use   Smoking status: Former    Packs/day: 1.00    Years: 35.00    Total pack years: 35.00    Types: Cigarettes    Quit date: 05/10/2008    Years since quitting: 13.6   Smokeless tobacco: Never   Tobacco comments:    Quit in 2009  Vaping Use   Vaping Use: Never used  Substance and Sexual Activity   Alcohol use: Yes    Comment: occasional   Drug use: No   Sexual activity: Yes  Other Topics Concern   Not on file  Social History Narrative   Not on file   Social Determinants of Health   Financial Resource Strain: Not on file  Food Insecurity: Not on file  Transportation Needs: Not on file  Physical Activity: Not on file  Stress: Not on file  Social Connections: Not on file  Intimate Partner Violence: Not on file    FAMILY HISTORY: Family History  Problem Relation Age of Onset   Anxiety disorder Mother    Depression Brother    Stroke Maternal Grandfather    Breast cancer Paternal Grandmother     ALLERGIES:  is allergic to bupropion, ancef [cefazolin sodium], and ioxaglate.  MEDICATIONS:  Current Outpatient Medications  Medication Sig Dispense Refill   diclofenac (VOLTAREN) 75 MG EC tablet Take 75 mg by mouth daily.     metoprolol (TOPROL-XL) 50 MG 24 hr tablet Take 50 mg by mouth daily.       Multiple  Vitamins-Minerals (CENTRUM SILVER 50+WOMEN PO) Take 1 tablet by mouth daily.     Na Sulfate-K Sulfate-Mg Sulf (SUPREP BOWEL PREP KIT) 17.5-3.13-1.6 GM/177ML SOLN Take 1 kit by mouth as directed. (Patient not taking: Reported on 12/13/2021) 354 mL 0   pantoprazole (PROTONIX) 40 MG tablet Take 40 mg by mouth daily.      rOPINIRole (REQUIP) 0.5 MG tablet Take 0.5 mg by mouth at bedtime.     rosuvastatin (CRESTOR) 10 MG tablet Take 10 mg by mouth daily.     solifenacin (VESICARE) 5 MG tablet Take 1 tablet (5 mg total) by mouth 2 (two) times daily. 60 tablet 11   No current facility-administered medications for this visit.    REVIEW OF SYSTEMS:   Constitutional: Denies fevers, chills or abnormal night sweats Breast:  Denies any palpable lumps or discharge All other systems were reviewed with the patient and are negative.  PHYSICAL EXAMINATION: ECOG PERFORMANCE STATUS: 1 - Symptomatic but completely ambulatory  Vitals:   12/18/21 1612  BP: 122/81  Pulse: 77  Resp: 18  Temp: (!) 97.4 F (36.3 C)  SpO2: 99%   Filed Weights   12/18/21 1612  Weight: 193 lb 8 oz (87.8 kg)    GENERAL:alert, no distress and comfortable    LABORATORY DATA:  I have reviewed the data as listed Lab Results  Component Value Date   WBC 9.2 10/03/2020   HGB 13.3 10/03/2020   HCT 41.8 10/03/2020   MCV 92.1 10/03/2020   PLT 266 10/03/2020   Lab Results  Component Value Date   NA 136 10/03/2020   K 4.2 10/03/2020   CL 102 10/03/2020   CO2 27 10/03/2020    RADIOGRAPHIC STUDIES: I have personally reviewed the radiological reports and agreed with the findings in the report.  ASSESSMENT AND PLAN:  Malignant neoplasm of upper-outer quadrant of left breast in female, estrogen receptor negative ([place]) 12/13/2021: Screening detected abnormal calcifications left breast 1.8 cm, left breast biopsy UOQ: Grade 2 IDC with apocrine features, with high-grade DCIS, ER 0%, PR 0%, HER2 3+ positive, Ki-67 5% (History of  left breast cancer status postlumpectomy 2005, radiation and tamoxifen for 5 years)  Pathology and radiology counseling: Discussed with the patient, the details of pathology including the type of breast cancer,the clinical staging, the significance of ER, PR and HER-2/neu receptors and the implications for treatment. After reviewing the pathology in detail, we proceeded to discuss the different treatment options between surgery, radiation, chemotherapy, antiestrogen therapies.  Treatment plan: 1.  Mastectomy with reconstruction 2. adjuvant chemotherapy with Taxol Herceptin weekly x12 followed by Herceptin maintenance for 1 year Patient plans to go to a cruise vacation in September and probably wants to  do her chemo after she comes back from it.  Return to clinic after surgery to discuss the final pathology report.   All questions were answered. The patient knows to call the clinic with any problems, questions or concerns.    Harriette Ohara, MD 12/18/21  I Gardiner Coins am scribing for Dr. Lindi Adie  I have reviewed the above documentation for accuracy and completeness, and I agree with the above.

## 2021-12-18 NOTE — Assessment & Plan Note (Addendum)
12/13/2021: Screening detected abnormal calcifications left breast 1.8 cm, left breast biopsy UOQ: Grade 2 IDC with apocrine features, with high-grade DCIS, ER 0%, PR 0%, HER2 3+ positive, Ki-67 5% (History of left breast cancer status postlumpectomy 2005, radiation and tamoxifen for 5 years)  Pathology and radiology counseling: Discussed with the patient, the details of pathology including the type of breast cancer,the clinical staging, the significance of ER, PR and HER-2/neu receptors and the implications for treatment. After reviewing the pathology in detail, we proceeded to discuss the different treatment options between surgery, radiation, chemotherapy, antiestrogen therapies.  Treatment plan: 1.  Mastectomy with reconstruction 2. adjuvant chemotherapy with Taxol Herceptin weekly x12 followed by Herceptin maintenance for 1 year Patient plans to go to a cruise vacation in September and probably wants to do her chemo after she comes back from it.  Return to clinic after surgery to discuss the final pathology report.

## 2021-12-21 ENCOUNTER — Ambulatory Visit: Payer: PPO | Admitting: Plastic Surgery

## 2021-12-21 ENCOUNTER — Other Ambulatory Visit: Payer: Self-pay | Admitting: Surgery

## 2021-12-21 ENCOUNTER — Encounter: Payer: Self-pay | Admitting: Plastic Surgery

## 2021-12-21 VITALS — BP 145/98 | HR 79 | Ht 66.5 in | Wt 193.8 lb

## 2021-12-21 DIAGNOSIS — C50412 Malignant neoplasm of upper-outer quadrant of left female breast: Secondary | ICD-10-CM

## 2021-12-21 DIAGNOSIS — Z299 Encounter for prophylactic measures, unspecified: Secondary | ICD-10-CM | POA: Diagnosis not present

## 2021-12-21 DIAGNOSIS — I10 Essential (primary) hypertension: Secondary | ICD-10-CM | POA: Diagnosis not present

## 2021-12-21 DIAGNOSIS — Z171 Estrogen receptor negative status [ER-]: Secondary | ICD-10-CM

## 2021-12-21 DIAGNOSIS — M25519 Pain in unspecified shoulder: Secondary | ICD-10-CM | POA: Diagnosis not present

## 2021-12-21 NOTE — H&P (View-Only) (Signed)
Patient ID: Kaitlyn Santana, female    DOB: August 18, 1955, 66 y.o.   MRN: [id number]   Chief Complaint  Patient presents with   Advice Only    The patient is a 66 year old female here with her daughter for consultation for breast reconstruction.  She is 5 feet 6 inches tall weighs 193 pounds.  She had left breast cancer in 2005 and even and was treated with a lumpectomy and radiation.  She was recently diagnosed with invasive and in situ ductal carcinoma of the left breast in the upper outer quadrant.  She was treated with tamoxifen for 5 years.  She is planning on a full mastectomy without salvage of the nipple areola.  She would like to move ahead with reconstruction.  She quit smoking several years ago.  She is a prediabetic.  Her general surgeon is Dr. Ninfa Linden.  And her preoperative bra size is a C cup. The left breast is at least a cup size smaller than the right.    Review of Systems  Constitutional: Negative.   HENT: Negative.    Eyes: Negative.   Respiratory: Negative.  Negative for chest tightness and shortness of breath.   Cardiovascular: Negative.  Negative for leg swelling.  Gastrointestinal: Negative.   Endocrine: Negative.   Genitourinary: Negative.   Musculoskeletal: Negative.   Skin: Negative.     Past Medical History:  Diagnosis Date   Anxiety    Arthritis    Asthma    as a child only   Bronchitis    Cancer of left breast ([place]) 08/2003   under care of Dr. Sonny Dandy   Depression    GERD (gastroesophageal reflux disease)    Headache    HTN (hypertension)    Hyperlipidemia    Irritable bowel disease    Lumbar herniated disc    Other dyspnea and respiratory abnormality    Palpitations    Rectal bleeding 08/10/2018    Past Surgical History:  Procedure Laterality Date   BREAST SURGERY     CANCER DIAG 08-2003   COLONOSCOPY WITH PROPOFOL N/A 11/29/2020   Procedure: COLONOSCOPY WITH PROPOFOL;  Surgeon: Rogene Houston, MD;  [facility];  Service:  Endoscopy;  Laterality: N/A;  12:50   LUMBAR LAMINECTOMY/DECOMPRESSION MICRODISCECTOMY Right 10/31/2016   Procedure: Right Lumbar Five-Sacral One Lumbar laminotomy and microdiscectomy;  Surgeon: Jovita Gamma, MD;  Location: [place];  Service: Neurosurgery;  Laterality: Right;  Right   POLYPECTOMY  11/29/2020   Procedure: POLYPECTOMY;  Surgeon: Rogene Houston, MD;  [facility];  Service: Endoscopy;;   TOE SURGERY     VAGINAL HYSTERECTOMY        Current Outpatient Medications:    diclofenac (VOLTAREN) 75 MG EC tablet, Take 75 mg by mouth daily., Disp: , Rfl:    metoprolol (TOPROL-XL) 50 MG 24 hr tablet, Take 50 mg by mouth daily.  , Disp: , Rfl:    Multiple Vitamins-Minerals (CENTRUM SILVER 50+WOMEN PO), Take 1 tablet by mouth daily., Disp: , Rfl:    pantoprazole (PROTONIX) 40 MG tablet, Take 40 mg by mouth daily. , Disp: , Rfl:    rOPINIRole (REQUIP) 0.5 MG tablet, Take 0.5 mg by mouth at bedtime., Disp: , Rfl:    rosuvastatin (CRESTOR) 10 MG tablet, Take 10 mg by mouth daily., Disp: , Rfl:    solifenacin (VESICARE) 5 MG tablet, Take 1 tablet (5 mg total) by mouth 2 (two) times daily., Disp: 60 tablet, Rfl: 11  Objective:   Vitals:   12/21/21 1127  BP: (!) 145/98  Pulse: 79  SpO2: 94%    Physical Exam Vitals and nursing note reviewed.  Constitutional:      Appearance: Normal appearance.  HENT:     Head: Normocephalic and atraumatic.  Cardiovascular:     Rate and Rhythm: Normal rate.     Pulses: Normal pulses.  Pulmonary:     Effort: Pulmonary effort is normal.  Abdominal:     General: There is no distension.     Palpations: Abdomen is soft.     Tenderness: There is no abdominal tenderness.  Musculoskeletal:        General: No swelling or deformity.  Skin:    General: Skin is warm.     Capillary Refill: Capillary refill takes less than 2 seconds.  Neurological:     Mental Status: She is alert and oriented to person, place, and time.     Assessment &  Plan:  Malignant neoplasm of upper-outer quadrant of left breast in female, estrogen receptor negative ([place])  The options for reconstruction we explained to the patient / family for breast reconstruction.  There are two general categories of reconstruction.  We can reconstruction a breast with implants or use the patient's own tissue.  These were further discussed as listed.  Breast reconstruction is an optional procedure and eligibility depends on the full spectrum of the health of the patient and any co-morbidities.  More than one surgery is often needed to complete the reconstruction process.  The process can take three to twelve months to complete.  The breasts will not be identical due to many factors such as rib differences, shoulder asymmetry and treatments such as radiation.  The goal is to get the breasts to look normal and symmetrical in clothes.  Scars are a part of surgery and may fade some in time but will always be present under clothes.  Surgery may be an option on the non-cancer breast to achieve more symmetry.  No matter which procedure is chosen there is always the risk of complications and even failure of the body to heal.  This could result in no breast.    The options for reconstruction include:  1. Placement of a tissue expander with Acellular dermal matrix. When the expander is the desired size surgery is performed to remove the expander and place an implant.  In some cases the implant can be placed without an expander.  2. Autologous reconstruction can include using a muscle or tissue from another area of the body to create a breast.  3. Combined procedures (ie. latissismus dorsi flap) can be done with an expander / implant placed under the muscle.   The risks, benefits, scars and recovery time were discussed for each of the above. Risks include bleeding, infection, hematoma, seroma, scarring, pain, wound healing complications, flap loss, fat necrosis, capsular contracture, need  for implant removal, donor site complications, bulge, hernia, umbilical necrosis, need for urgent reoperation, and need for dressing changes.   The procedure the patient selected / that was best for the patient, was then discussed in further detail.  Total time: 45 minutes. This includes time spent with the patient during the visit as well as time spent before and after the visit reviewing the chart, documenting the encounter, making phone calls and reviewing studies.   The patient understands that the risks are much higher because she has had radiation on the left breast.  It is reasonable to  try to get her expanded.  If it does not work then we may need to go to a latissimus muscle flap.  Or we will have to settle with a smaller breast size and then get the other side to match.  The patient acknowledges understanding this.  I have shared the above information with Dr. Ninfa Linden.  The plan is for immediate left breast reconstruction without salvage of the nipple areolar complex with expander and Flex HD placement.  She would like to get this done so she can still go on her cruise in September.  She understands she will have the expander for the cruise.  Pictures were obtained of the patient and placed in the chart with the patient's or guardian's permission.   Dawson, DO

## 2021-12-21 NOTE — Progress Notes (Signed)
Patient ID: Kaitlyn Santana, female    DOB: [birth year], 66 y.o.   MRN: [id number]   Chief Complaint  Patient presents with   Advice Only    The patient is a 66 year old female here with her daughter for consultation for breast reconstruction.  She is 5 feet 6 inches tall weighs 193 pounds.  She had left breast cancer in 2005 and even and was treated with a lumpectomy and radiation.  She was recently diagnosed with invasive and in situ ductal carcinoma of the left breast in the upper outer quadrant.  She was treated with tamoxifen for 5 years.  She is planning on a full mastectomy without salvage of the nipple areola.  She would like to move ahead with reconstruction.  She quit smoking several years ago.  She is a prediabetic.  Her general surgeon is Dr. Ninfa Linden.  And her preoperative bra size is a C cup. The left breast is at least a cup size smaller than the right.    Review of Systems  Constitutional: Negative.   HENT: Negative.    Eyes: Negative.   Respiratory: Negative.  Negative for chest tightness and shortness of breath.   Cardiovascular: Negative.  Negative for leg swelling.  Gastrointestinal: Negative.   Endocrine: Negative.   Genitourinary: Negative.   Musculoskeletal: Negative.   Skin: Negative.     Past Medical History:  Diagnosis Date   Anxiety    Arthritis    Asthma    as a child only   Bronchitis    Cancer of left breast ([place]) 08/2003   under care of Dr. Sonny Dandy   Depression    GERD (gastroesophageal reflux disease)    Headache    HTN (hypertension)    Hyperlipidemia    Irritable bowel disease    Lumbar herniated disc    Other dyspnea and respiratory abnormality    Palpitations    Rectal bleeding 08/10/2018    Past Surgical History:  Procedure Laterality Date   BREAST SURGERY     CANCER DIAG 08-2003   COLONOSCOPY WITH PROPOFOL N/A 11/29/2020   Procedure: COLONOSCOPY WITH PROPOFOL;  Surgeon: Rogene Houston, MD;  [facility];  Service:  Endoscopy;  Laterality: N/A;  12:50   LUMBAR LAMINECTOMY/DECOMPRESSION MICRODISCECTOMY Right 10/31/2016   Procedure: Right Lumbar Five-Sacral One Lumbar laminotomy and microdiscectomy;  Surgeon: Jovita Gamma, MD;  Location: [place];  Service: Neurosurgery;  Laterality: Right;  Right   POLYPECTOMY  11/29/2020   Procedure: POLYPECTOMY;  Surgeon: Rogene Houston, MD;  [facility];  Service: Endoscopy;;   TOE SURGERY     VAGINAL HYSTERECTOMY        Current Outpatient Medications:    diclofenac (VOLTAREN) 75 MG EC tablet, Take 75 mg by mouth daily., Disp: , Rfl:    metoprolol (TOPROL-XL) 50 MG 24 hr tablet, Take 50 mg by mouth daily.  , Disp: , Rfl:    Multiple Vitamins-Minerals (CENTRUM SILVER 50+WOMEN PO), Take 1 tablet by mouth daily., Disp: , Rfl:    pantoprazole (PROTONIX) 40 MG tablet, Take 40 mg by mouth daily. , Disp: , Rfl:    rOPINIRole (REQUIP) 0.5 MG tablet, Take 0.5 mg by mouth at bedtime., Disp: , Rfl:    rosuvastatin (CRESTOR) 10 MG tablet, Take 10 mg by mouth daily., Disp: , Rfl:    solifenacin (VESICARE) 5 MG tablet, Take 1 tablet (5 mg total) by mouth 2 (two) times daily., Disp: 60 tablet, Rfl: 11  Objective:   Vitals:   12/21/21 1127  BP: (!) 145/98  Pulse: 79  SpO2: 94%    Physical Exam Vitals and nursing note reviewed.  Constitutional:      Appearance: Normal appearance.  HENT:     Head: Normocephalic and atraumatic.  Cardiovascular:     Rate and Rhythm: Normal rate.     Pulses: Normal pulses.  Pulmonary:     Effort: Pulmonary effort is normal.  Abdominal:     General: There is no distension.     Palpations: Abdomen is soft.     Tenderness: There is no abdominal tenderness.  Musculoskeletal:        General: No swelling or deformity.  Skin:    General: Skin is warm.     Capillary Refill: Capillary refill takes less than 2 seconds.  Neurological:     Mental Status: She is alert and oriented to person, place, and time.     Assessment &  Plan:  Malignant neoplasm of upper-outer quadrant of left breast in female, estrogen receptor negative ([place])  The options for reconstruction we explained to the patient / family for breast reconstruction.  There are two general categories of reconstruction.  We can reconstruction a breast with implants or use the patient's own tissue.  These were further discussed as listed.  Breast reconstruction is an optional procedure and eligibility depends on the full spectrum of the health of the patient and any co-morbidities.  More than one surgery is often needed to complete the reconstruction process.  The process can take three to twelve months to complete.  The breasts will not be identical due to many factors such as rib differences, shoulder asymmetry and treatments such as radiation.  The goal is to get the breasts to look normal and symmetrical in clothes.  Scars are a part of surgery and may fade some in time but will always be present under clothes.  Surgery may be an option on the non-cancer breast to achieve more symmetry.  No matter which procedure is chosen there is always the risk of complications and even failure of the body to heal.  This could result in no breast.    The options for reconstruction include:  1. Placement of a tissue expander with Acellular dermal matrix. When the expander is the desired size surgery is performed to remove the expander and place an implant.  In some cases the implant can be placed without an expander.  2. Autologous reconstruction can include using a muscle or tissue from another area of the body to create a breast.  3. Combined procedures (ie. latissismus dorsi flap) can be done with an expander / implant placed under the muscle.   The risks, benefits, scars and recovery time were discussed for each of the above. Risks include bleeding, infection, hematoma, seroma, scarring, pain, wound healing complications, flap loss, fat necrosis, capsular contracture, need  for implant removal, donor site complications, bulge, hernia, umbilical necrosis, need for urgent reoperation, and need for dressing changes.   The procedure the patient selected / that was best for the patient, was then discussed in further detail.  Total time: 45 minutes. This includes time spent with the patient during the visit as well as time spent before and after the visit reviewing the chart, documenting the encounter, making phone calls and reviewing studies.   The patient understands that the risks are much higher because she has had radiation on the left breast.  It is reasonable to  try to get her expanded.  If it does not work then we may need to go to a latissimus muscle flap.  Or we will have to settle with a smaller breast size and then get the other side to match.  The patient acknowledges understanding this.  I have shared the above information with Dr. Ninfa Linden.  The plan is for immediate left breast reconstruction without salvage of the nipple areolar complex with expander and Flex HD placement.  She would like to get this done so she can still go on her cruise in September.  She understands she will have the expander for the cruise.  Pictures were obtained of the patient and placed in the chart with the patient's or guardian's permission.   Clawson, DO

## 2021-12-25 ENCOUNTER — Encounter: Payer: Self-pay | Admitting: *Deleted

## 2021-12-25 ENCOUNTER — Inpatient Hospital Stay: Payer: PPO | Admitting: Licensed Clinical Social Worker

## 2021-12-25 NOTE — Progress Notes (Signed)
[place] Work  Initial Assessment   Kaitlyn Santana is a 66 y.o. year old female contacted by phone. Clinical Social Work was referred by  new pt protocol  for assessment of psychosocial needs.   SDOH (Social Determinants of Health) assessments performed: Yes SDOH Interventions    Flowsheet Row Most Recent Value  SDOH Interventions   Food Insecurity Interventions Intervention Not Indicated  Financial Strain Interventions Development worker, community, Other (Comment)  [cancer foundations]  Housing Interventions Intervention Not Indicated       SDOH Screenings   Alcohol Screen: Not on file  Depression (PHQ2-9): Not on file  Financial Resource Strain: Medium Risk (12/25/2021)   Overall Financial Resource Strain (CARDIA)    Difficulty of Paying Living Expenses: Somewhat hard  Food Insecurity: No Food Insecurity (12/25/2021)   Hunger Vital Sign    Worried About Running Out of Food in the Last Year: Never true    Ran Out of Food in the Last Year: Never true  Housing: Low Risk  (12/25/2021)   Housing    Last Housing Risk Score: 0  Physical Activity: Not on file  Social Connections: Not on file  Stress: Not on file  Tobacco Use: Medium Risk (12/21/2021)   Patient History    Smoking Tobacco Use: Former    Smokeless Tobacco Use: Never    Passive Exposure: Not on file  Transportation Needs: Not on file     Distress Screen completed: No     No data to display            Family/Social Information:  Housing Arrangement: patient lives alone with her 2 dogs that are 23 yo Family members/support persons in your life? Family and Friends Transportation concerns: no  Employment: Retired.  Income source: Paediatric nurse concerns:  Yes, due to medical expenses Type of concern: Medical bills Food access concerns: no Religious or spiritual practice: Not known Services Currently in place:  n/a  Coping/ Adjustment to diagnosis: Patient understands treatment  plan and what happens next? yes, is stressed thinking about surgery as she will need help with her two dogs afterward since she won't be able to lift them. Does have some friends who can help Concerns about diagnosis and/or treatment: How I will pay for the services I need and caring for her dogs Patient reported stressors: Finances Current coping skills/ strengths: Capable of independent living  and Supportive family/friends     SUMMARY: Current SDOH Barriers:  Financial constraints related to cost of treatment  Clinical Social Work Clinical Goal(s):  Explore community resource options for unmet needs related to:  Financial Strain   Interventions: Discussed common feeling and emotions when being diagnosed with cancer, and the importance of support during treatment Informed patient of the support team roles and support services at [facility] Provided Uncertain contact information and encouraged patient to call with any questions or concerns Provided patient with information about cancer foundations that offer financial assistance   Follow Up Plan: Patient will work on applications for assistance and return them to this CSW Patient verbalizes understanding of plan: Yes    Daruis Swaim E Nur Rabold, LCSW

## 2021-12-28 ENCOUNTER — Other Ambulatory Visit: Payer: Self-pay | Admitting: Surgery

## 2021-12-28 ENCOUNTER — Other Ambulatory Visit (HOSPITAL_COMMUNITY): Payer: Self-pay | Admitting: Surgery

## 2021-12-28 ENCOUNTER — Telehealth: Payer: Self-pay | Admitting: Hematology and Oncology

## 2021-12-28 ENCOUNTER — Encounter: Payer: Self-pay | Admitting: *Deleted

## 2021-12-28 DIAGNOSIS — Z853 Personal history of malignant neoplasm of breast: Secondary | ICD-10-CM

## 2021-12-28 NOTE — Telephone Encounter (Signed)
.  Called patient to schedule appointment per 7/20 inbasket, patient is aware of date and time.   

## 2022-01-01 ENCOUNTER — Telehealth: Payer: Self-pay

## 2022-01-01 ENCOUNTER — Encounter: Payer: Self-pay | Admitting: *Deleted

## 2022-01-01 NOTE — Telephone Encounter (Signed)
Called [employer] to check status of authorization. Advised the surgery information was faxed on 12/26/2021 to [phone]. Confirmed faxed number was the correct number. Re-faxed with STAT

## 2022-01-02 ENCOUNTER — Telehealth: Payer: Self-pay

## 2022-01-02 ENCOUNTER — Telehealth: Payer: Self-pay | Admitting: Hematology and Oncology

## 2022-01-02 ENCOUNTER — Inpatient Hospital Stay: Payer: PPO | Admitting: Licensed Clinical Social Worker

## 2022-01-02 NOTE — Telephone Encounter (Signed)
Authorization#[id number] for CPT 19357 and [id number]

## 2022-01-02 NOTE — Progress Notes (Signed)
[place] CSW Progress Note  Holiday representative met with patient to follow-up on applications for financial assistance. Completed and submitted [employer] application today. Waiting on tax return from pt to submit [employer] application.  CSW also provided brief supportive counsel for pt as she is overwhelmed with the amount of appointments and the waiting for surgery. CSW encouraged pt to reach out when she needs additional support.    Teka Chanda E Travonne Schowalter, LCSW

## 2022-01-02 NOTE — Telephone Encounter (Signed)
Rescheduled appointment per 7/26 staff message. Patient is aware of the changes made to her upcoming appointment.

## 2022-01-04 ENCOUNTER — Encounter: Payer: PPO | Admitting: Physician Assistant

## 2022-01-07 ENCOUNTER — Ambulatory Visit (INDEPENDENT_AMBULATORY_CARE_PROVIDER_SITE_OTHER): Payer: PPO | Admitting: Physician Assistant

## 2022-01-07 VITALS — BP 136/94 | HR 78 | Ht 66.5 in | Wt 193.2 lb

## 2022-01-07 DIAGNOSIS — C50412 Malignant neoplasm of upper-outer quadrant of left female breast: Secondary | ICD-10-CM

## 2022-01-07 DIAGNOSIS — Z171 Estrogen receptor negative status [ER-]: Secondary | ICD-10-CM

## 2022-01-07 MED ORDER — SULFAMETHOXAZOLE-TRIMETHOPRIM 800-160 MG PO TABS: 1.0000 | ORAL_TABLET | Freq: Two times a day (BID) | ORAL | 0 refills | Status: DC

## 2022-01-07 MED ORDER — HYDROCODONE-ACETAMINOPHEN 5-325 MG PO TABS: 1.0000 | ORAL_TABLET | Freq: Four times a day (QID) | ORAL | 0 refills | Status: AC | PRN

## 2022-01-07 NOTE — Progress Notes (Signed)
Patient ID: Kaitlyn Santana, female    DOB: May 24, 1956, 66 y.o.   MRN: [id number]  Chief Complaint  Patient presents with   Pre-op Exam    No diagnosis found.   History of Present Illness: Kaitlyn Santana is a 66 y.o.  female  with a history of left breast cancer in 2005 and treated with lumpectomy and radiation.  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  She was treated with tamoxifen for 5 years.  She has seen Dr. Ninfa Linden and is planning for a full mastectomy of the left breast without salvage of the nipple areolar complex.  She is prediabetic her preoperative bra size is a C cup and her right breast is at least 1 cup size larger the left.  She presents for preoperative evaluation for upcoming procedure, left breast reconstruction without salvage of the nipple areolar complex with expander and Flex HD placement, scheduled for 01/17/2022 with Dr. Marla Roe in conjunction with Dr. Ninfa Linden.  The patient has not had problems with anesthesia.   Summary of Previous Visit: The patient was last seen in our office on 12/21/2021 by Dr. Marla Roe who discussed treatment options.  Job: Retired  [employer] Significant for: Hypertension   Past Medical History: Allergies: Allergies  Allergen Reactions   Bupropion Itching   Ancef [Cefazolin Sodium] Hives and Itching   Ioxaglate Itching    Current Medications:  Current Outpatient Medications:    diclofenac (VOLTAREN) 75 MG EC tablet, Take 75 mg by mouth daily., Disp: , Rfl:    metoprolol (TOPROL-XL) 50 MG 24 hr tablet, Take 50 mg by mouth daily.  , Disp: , Rfl:    Multiple Vitamins-Minerals (CENTRUM SILVER 50+WOMEN PO), Take 1 tablet by mouth daily., Disp: , Rfl:    pantoprazole (PROTONIX) 40 MG tablet, Take 40 mg by mouth daily. , Disp: , Rfl:    rOPINIRole (REQUIP) 0.5 MG tablet, Take 0.5 mg by mouth at bedtime., Disp: , Rfl:    rosuvastatin (CRESTOR) 10 MG tablet, Take 10  mg by mouth daily., Disp: , Rfl:    solifenacin (VESICARE) 5 MG tablet, Take 1 tablet (5 mg total) by mouth 2 (two) times daily., Disp: 60 tablet, Rfl: 11  Past Medical Problems: Past Medical History:  Diagnosis Date   Anxiety    Arthritis    Asthma    as a child only   Bronchitis    Cancer of left breast ([place]) 08/2003   under care of Dr. Sonny Dandy   Depression    GERD (gastroesophageal reflux disease)    Headache    HTN (hypertension)    Hyperlipidemia    Irritable bowel disease    Lumbar herniated disc    Other dyspnea and respiratory abnormality    Palpitations    Rectal bleeding 08/10/2018    Past Surgical History: Past Surgical History:  Procedure Laterality Date   BREAST SURGERY     CANCER DIAG 08-2003   COLONOSCOPY WITH PROPOFOL N/A 11/29/2020   Procedure: COLONOSCOPY WITH PROPOFOL;  Surgeon: Rogene Houston, MD;  [facility];  Service: Endoscopy;  Laterality: N/A;  12:50   LUMBAR LAMINECTOMY/DECOMPRESSION MICRODISCECTOMY Right 10/31/2016   Procedure: Right Lumbar Five-Sacral One Lumbar laminotomy and microdiscectomy;  Surgeon: Jovita Gamma, MD;  Location: [facility];  Service: Neurosurgery;  Laterality: Right;  Right   POLYPECTOMY  11/29/2020   Procedure: POLYPECTOMY;  Surgeon: Rogene Houston, MD;  [facility];  Service: Endoscopy;;   TOE SURGERY     VAGINAL HYSTERECTOMY      Social History: Social History   Socioeconomic History   Marital status: Married    Spouse name: RONALD   Number of children: 1   Years of education: Not on file   Highest education level: Not on file  Occupational History   Occupation: [employer]    Employer: [employer]  Tobacco Use   Smoking status: Former    Packs/day: 1.00    Years: 35.00    Total pack years: 35.00    Types: Cigarettes    Quit date: 05/10/2008    Years since quitting: 13.6   Smokeless tobacco: Never   Tobacco comments:    Quit in 2009  Vaping Use   Vaping Use: Never used   Substance and Sexual Activity   Alcohol use: Yes    Comment: occasional   Drug use: No   Sexual activity: Yes  Other Topics Concern   Not on file  Social History Narrative   Not on file   Social Determinants of Health   Financial Resource Strain: Medium Risk (12/25/2021)   Overall Financial Resource Strain (CARDIA)    Difficulty of Paying Living Expenses: Somewhat hard  Food Insecurity: No Food Insecurity (12/25/2021)   Hunger Vital Sign    Worried About Running Out of Food in the Last Year: Never true    Ran Out of Food in the Last Year: Never true  Transportation Needs: Not on file  Physical Activity: Not on file  Stress: Not on file  Social Connections: Not on file  Intimate Partner Violence: Not on file    Family History: Family History  Problem Relation Age of Onset   Anxiety disorder Mother    Depression Brother    Stroke Maternal Grandfather    Breast cancer Paternal Grandmother     Review of Systems: ROS  Physical Exam: Vital Signs BP (!) 136/94 (BP Location: Right Arm, Patient Position: Sitting, Cuff Size: Small)   Pulse 78   Ht 5' 6.5" (1.689 m)   Wt 193 lb 3.2 oz (87.6 kg)   SpO2 95%   BMI 30.72 kg/m   Physical Exam   Constitutional:      General: Not in acute distress.    Appearance: Normal appearance. Not ill-appearing.  Cardiovascular:     Rate and Rhythm: Normal rate. Pulmonary:     Effort: No respiratory distress or increased work of breathing.  Speaks in full sentences.  Musculoskeletal: Normal range of motion. No lower extremity swelling or edema. No varicosities.  Skin:    General: Skin is warm and dry.     Findings: No erythema or rash.  Neurological:     Mental Status: Alert and oriented to person, place, and time.  Psychiatric:        Mood and Affect: Mood normal.        Behavior: Behavior normal.    Assessment/Plan: The patient is scheduled for left breast reconstruction with expander and Flex HD placement with Dr.  Marla Roe.  Risks, benefits, and alternatives of procedure discussed, questions answered and consent obtained.    Smoking Status: Non-smoker; Counseling Given?  Last Mammogram: 11/30/2021  Caprini Score: 5;  recommendation for early ambulation.   Pictures obtained: On 12/21/2021  Post-op Rx sent to pharmacy: Bactrim, Vicodin  Patient was provided with the  General Surgical Risk consent document and Pain Medication Agreement prior to their appointment.  They had adequate time to read through the risk consent documents and Pain Medication Agreement. We also discussed them in person together during this preop appointment. All of their questions were answered to their satisfaction.  Recommended calling if they have any further questions.  Risk consent form and Pain Medication Agreement to be scanned into patient's chart.    Electronically signed by: Stevie Kern Anel Purohit, PA-C 01/07/2022 3:45 PM

## 2022-01-07 NOTE — H&P (View-Only) (Signed)
Patient ID: Kaitlyn Santana, female    DOB: Mar 08, 1956, 66 y.o.   MRN: [id number]  Chief Complaint  Patient presents with   Pre-op Exam    No diagnosis found.   History of Present Illness: Kaitlyn Santana is a 66 y.o.  female  with a history of left breast cancer in 2005 and treated with lumpectomy and radiation.  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  She was treated with tamoxifen for 5 years.  She has seen Dr. Ninfa Linden and is planning for a full mastectomy of the left breast without salvage of the nipple areolar complex.  She is prediabetic her preoperative bra size is a C cup and her right breast is at least 1 cup size larger the left.  She presents for preoperative evaluation for upcoming procedure, left breast reconstruction without salvage of the nipple areolar complex with expander and Flex HD placement, scheduled for 01/17/2022 with Dr. Marla Roe in conjunction with Dr. Ninfa Linden.  The patient has not had problems with anesthesia.   Summary of Previous Visit: The patient was last seen in our office on 12/21/2021 by Dr. Marla Roe who discussed treatment options.  Job: Retired  [employer] Significant for: Hypertension   Past Medical History: Allergies: Allergies  Allergen Reactions   Bupropion Itching   Ancef [Cefazolin Sodium] Hives and Itching   Ioxaglate Itching    Current Medications:  Current Outpatient Medications:    diclofenac (VOLTAREN) 75 MG EC tablet, Take 75 mg by mouth daily., Disp: , Rfl:    metoprolol (TOPROL-XL) 50 MG 24 hr tablet, Take 50 mg by mouth daily.  , Disp: , Rfl:    Multiple Vitamins-Minerals (CENTRUM SILVER 50+WOMEN PO), Take 1 tablet by mouth daily., Disp: , Rfl:    pantoprazole (PROTONIX) 40 MG tablet, Take 40 mg by mouth daily. , Disp: , Rfl:    rOPINIRole (REQUIP) 0.5 MG tablet, Take 0.5 mg by mouth at bedtime., Disp: , Rfl:    rosuvastatin (CRESTOR) 10 MG tablet, Take 10  mg by mouth daily., Disp: , Rfl:    solifenacin (VESICARE) 5 MG tablet, Take 1 tablet (5 mg total) by mouth 2 (two) times daily., Disp: 60 tablet, Rfl: 11  Past Medical Problems: Past Medical History:  Diagnosis Date   Anxiety    Arthritis    Asthma    as a child only   Bronchitis    Cancer of left breast ([place]) 08/2003   under care of Dr. Sonny Dandy   Depression    GERD (gastroesophageal reflux disease)    Headache    HTN (hypertension)    Hyperlipidemia    Irritable bowel disease    Lumbar herniated disc    Other dyspnea and respiratory abnormality    Palpitations    Rectal bleeding 08/10/2018    Past Surgical History: Past Surgical History:  Procedure Laterality Date   BREAST SURGERY     CANCER DIAG 08-2003   COLONOSCOPY WITH PROPOFOL N/A 11/29/2020   Procedure: COLONOSCOPY WITH PROPOFOL;  Surgeon: Rogene Houston, MD;  [facility];  Service: Endoscopy;  Laterality: N/A;  12:50   LUMBAR LAMINECTOMY/DECOMPRESSION MICRODISCECTOMY Right 10/31/2016   Procedure: Right Lumbar Five-Sacral One Lumbar laminotomy and microdiscectomy;  Surgeon: Jovita Gamma, MD;  Location: [facility];  Service: Neurosurgery;  Laterality: Right;  Right   POLYPECTOMY  11/29/2020   Procedure: POLYPECTOMY;  Surgeon: Rogene Houston, MD;  [facility];  Service: Endoscopy;;   TOE SURGERY     VAGINAL HYSTERECTOMY      Social History: Social History   Socioeconomic History   Marital status: Married    Spouse name: RONALD   Number of children: 1   Years of education: Not on file   Highest education level: Not on file  Occupational History   Occupation: [employer]    Employer: [employer]  Tobacco Use   Smoking status: Former    Packs/day: 1.00    Years: 35.00    Total pack years: 35.00    Types: Cigarettes    Quit date: 05/10/2008    Years since quitting: 13.6   Smokeless tobacco: Never   Tobacco comments:    Quit in 2009  Vaping Use   Vaping Use: Never used   Substance and Sexual Activity   Alcohol use: Yes    Comment: occasional   Drug use: No   Sexual activity: Yes  Other Topics Concern   Not on file  Social History Narrative   Not on file   Social Determinants of Health   Financial Resource Strain: Medium Risk (12/25/2021)   Overall Financial Resource Strain (CARDIA)    Difficulty of Paying Living Expenses: Somewhat hard  Food Insecurity: No Food Insecurity (12/25/2021)   Hunger Vital Sign    Worried About Running Out of Food in the Last Year: Never true    Ran Out of Food in the Last Year: Never true  Transportation Needs: Not on file  Physical Activity: Not on file  Stress: Not on file  Social Connections: Not on file  Intimate Partner Violence: Not on file    Family History: Family History  Problem Relation Age of Onset   Anxiety disorder Mother    Depression Brother    Stroke Maternal Grandfather    Breast cancer Paternal Grandmother     Review of Systems: ROS  Physical Exam: Vital Signs BP (!) 136/94 (BP Location: Right Arm, Patient Position: Sitting, Cuff Size: Small)   Pulse 78   Ht 5' 6.5" (1.689 m)   Wt 193 lb 3.2 oz (87.6 kg)   SpO2 95%   BMI 30.72 kg/m   Physical Exam   Constitutional:      General: Not in acute distress.    Appearance: Normal appearance. Not ill-appearing.  Cardiovascular:     Rate and Rhythm: Normal rate. Pulmonary:     Effort: No respiratory distress or increased work of breathing.  Speaks in full sentences.  Musculoskeletal: Normal range of motion. No lower extremity swelling or edema. No varicosities.  Skin:    General: Skin is warm and dry.     Findings: No erythema or rash.  Neurological:     Mental Status: Alert and oriented to person, place, and time.  Psychiatric:        Mood and Affect: Mood normal.        Behavior: Behavior normal.    Assessment/Plan: The patient is scheduled for left breast reconstruction with expander and Flex HD placement with Dr.  Marla Roe.  Risks, benefits, and alternatives of procedure discussed, questions answered and consent obtained.    Smoking Status: Non-smoker; Counseling Given?  Last Mammogram: 11/30/2021  Caprini Score: 5;  recommendation for early ambulation.   Pictures obtained: On 12/21/2021  Post-op Rx sent to pharmacy: Bactrim, Vicodin  Patient was provided with the  General Surgical Risk consent document and Pain Medication Agreement prior to their appointment.  They had adequate time to read through the risk consent documents and Pain Medication Agreement. We also discussed them in person together during this preop appointment. All of their questions were answered to their satisfaction.  Recommended calling if they have any further questions.  Risk consent form and Pain Medication Agreement to be scanned into patient's chart.    Electronically signed by: Stevie Kern Amante Fomby, PA-C 01/07/2022 3:45 PM

## 2022-01-09 ENCOUNTER — Telehealth: Payer: Self-pay | Admitting: *Deleted

## 2022-01-09 NOTE — Telephone Encounter (Signed)
Connected with Kaitlyn Santana [phone] (home) regarding FMLA paperwork received for daughter, Rulon Abide.    "I received letter for jury duty February 12, 2022.  Can Dr. Lindi Adie write me out for this?"  Advised need for letter and juror number for provider along with [place] and [facility] cover sheet required for FMLA form.  Faxed to daughter's fax number noted on form received today from [employer].  Advised to return juror letter with completed and signed ROI and cover sheet.  Currently no further questions or needs.

## 2022-01-09 NOTE — Telephone Encounter (Signed)
Incoming call from [employer] "I am unable to tell my daughter what fax number you sent forms to.  Shelton Silvas asked you resend to fax no.# [phone]".

## 2022-01-10 ENCOUNTER — Other Ambulatory Visit: Payer: Self-pay

## 2022-01-10 ENCOUNTER — Encounter (HOSPITAL_BASED_OUTPATIENT_CLINIC_OR_DEPARTMENT_OTHER): Payer: Self-pay | Admitting: Surgery

## 2022-01-11 ENCOUNTER — Encounter (HOSPITAL_BASED_OUTPATIENT_CLINIC_OR_DEPARTMENT_OTHER)
Admission: RE | Admit: 2022-01-11 | Discharge: 2022-01-11 | Disposition: A | Discharge status: Home / Self Care | Payer: PPO | Source: Ambulatory Visit | Attending: Surgery | Admitting: Surgery

## 2022-01-11 DIAGNOSIS — I10 Essential (primary) hypertension: Secondary | ICD-10-CM | POA: Insufficient documentation

## 2022-01-11 MED ORDER — CHLORHEXIDINE GLUCONATE CLOTH 2 % EX PADS: 6.0000 | MEDICATED_PAD | Freq: Once | CUTANEOUS | Status: DC

## 2022-01-11 NOTE — Progress Notes (Signed)

## 2022-01-14 ENCOUNTER — Encounter: Payer: Self-pay | Admitting: *Deleted

## 2022-01-14 NOTE — Telephone Encounter (Signed)
Received fax today from [place] for [employer].  Request for Juror summons letter to provider pick up bin at this time,.

## 2022-01-14 NOTE — Progress Notes (Signed)
Per pt request RN successfully faxed Jury Duty Exempt to [employer] [phone]) and a copy to her daughter Kaitlyn Santana to provide to pt [phone]).

## 2022-01-16 NOTE — H&P (Signed)
REFERRING PHYSICIAN: Ignatius Specking, MD  PROVIDER: Wayne Both, MD  MRN: [id number] DOB: 03-18-1956  Subjective   Chief Complaint: New Consultation (Left Breast Cancer)   History of Present Illness: Kaitlyn Santana is a 66 y.o. female who is seen  as an office consultation for evaluation of New Consultation (Left Breast Cancer) .   This patient is referred here for evaluation of a newly diagnosed left breast cancer. She was found to have abnormal calcifications in the upper outer quadrant of her left breast. The area measured 1.8 cm. She underwent a biopsy showing both invasive and in situ ductal carcinoma. Receptor status is pending. She had a previous breast cancer on the left breast removed with a lumpectomy in 2005. She had postoperative radiation to the left breast and was on tamoxifen for 5 years. She also had lymph node biopsy at that time She denies nipple discharge. She is otherwise fairly healthy and without complaints.  Review of Systems: A complete review of systems was obtained from the patient. I have reviewed this information and discussed as appropriate with the patient. See HPI as well for other ROS.  ROS   Medical History: Past Medical History:  Diagnosis Date  Anxiety  Arthritis  Asthma, unspecified asthma severity, unspecified whether complicated, unspecified whether persistent  GERD (gastroesophageal reflux disease)  History of cancer  Hypertension   Patient Active Problem List  Diagnosis  Abnormal echocardiogram  Arthritis of right acromioclavicular joint  Biceps tendinitis, right  Closed nondisplaced fracture of greater tuberosity of right humerus  Depression, major, recurrent (CMS-HCC)  Diarrhea  Essential (primary) hypertension  GERD (gastroesophageal reflux disease)  Gross hematuria  HNP (herniated nucleus pulposus), lumbar  Irritable bowel syndrome without diarrhea  Palpitations  Primary osteoarthritis of left wrist  Rectal bleeding   RLS (restless legs syndrome)  Mixed stress and urge urinary incontinence   Past Surgical History:  Procedure Laterality Date  Breast Surgery Left 08/12/2003  Back Surgery 10/31/2016  COLON SURGERY 11/29/2020  HYSTERECTOMY  Toe Surgery    Allergies  Allergen Reactions  Bupropion Itching  Cefazolin Hives and Itching  Unknown  Ioxaglic Acid Itching   Current Outpatient Medications on File Prior to Visit  Medication Sig Dispense Refill  pantoprazole (PROTONIX) 40 MG DR tablet  rOPINIRole (REQUIP) 0.5 MG tablet Take by mouth  rosuvastatin (CRESTOR) 10 MG tablet Take 10 mg by mouth once daily  sodium, potassium, and magnesium (SUPREP BOWEL PREP KIT) oral solution Take by mouth  solifenacin (VESICARE) 5 MG tablet Take 5 mg by mouth 2 (two) times daily  metoprolol succinate (TOPROL-XL) 50 MG XL tablet Take 50 mg by mouth once daily  multivitamin with iron-minerals (SUPER THERA VITE M) tablet Take 1 tablet by mouth once daily   No current facility-administered medications on file prior to visit.   History reviewed. No pertinent family history.   Social History   Tobacco Use  Smoking Status Former  Types: Cigarettes  Quit date: 06/02/2008  Years since quitting: 13.5  Smokeless Tobacco Never    Social History   Socioeconomic History  Marital status: Married  Tobacco Use  Smoking status: Former  Types: Cigarettes  Quit date: 06/02/2008  Years since quitting: 13.5  Smokeless tobacco: Never  Substance and Sexual Activity  Alcohol use: Not Currently  Drug use: Never   Objective:   Vitals:   BP: (!) 152/80  Pulse: 110  Temp: 36.2 C (97.1 F)  SpO2: 97%  Weight: 88.5 kg (195 lb)  Height: 167.6 cm ($RemoveB'5\' 6"'mwRSHWhy$ )   Body mass index is 31.47 kg/m.  Physical Exam   She appears well on exam but anxious  There are no palpable breast masses in either breast and no axillary adenopathy. Her old left breast incision is well-healed. The nipple areolar complexes are  normal.  Labs, Imaging and Diagnostic Testing: I have reviewed her mammogram, ultrasound, and pathology results  Assessment and Plan:   Diagnoses and all orders for this visit:  Invasive ductal carcinoma of breast, left (CMS-HCC) - Ambulatory Referral to Oncology-Medical - Ambulatory Referral to Radiation Oncology   I had a discussion with the patient, her daughter, and friend regarding her diagnosis. We discussed breast cancer in detail. I gave them a copy of the pathology results. Again, her receptor status is pending. Given her previous history of left breast cancer with a lumpectomy and radiation therapy, she may not be a candidate for breast conservation. I will refer her preoperatively to medical and radiation oncology for further discussion as well. We will then discuss whether proceed with a lumpectomy and sentinel node biopsy versus a mastectomy. She is currently very anxious and still uncertain which direction she would like to proceed with. After she has seen the oncologist, we will then consider how to proceed.   Addendum: She has now been seen by oncology and plastic surgery.  Because of her previous cancer, the decision has been made to proceed with a left total mastectomy and sentinel lymph node biopsy.  Because it is HER2 positive, a Port-A-Cath will be inserted as well at the time of surgery. We discussed the risk of surgery.  These risks include but are not limited to bleeding, infection, injury to surrounding structures, arm swelling, pneumothorax with port insertion, the need for other procedures, cardiopulmonary issues, postoperative recovery, etc.  She will have immediate reconstruction by plastic surgery at the time of the procedure as well. She and her family understand and agree to proceed

## 2022-01-17 ENCOUNTER — Ambulatory Visit (HOSPITAL_BASED_OUTPATIENT_CLINIC_OR_DEPARTMENT_OTHER): Payer: PPO | Admitting: Anesthesiology

## 2022-01-17 ENCOUNTER — Encounter (HOSPITAL_BASED_OUTPATIENT_CLINIC_OR_DEPARTMENT_OTHER)
Admission: AD | Disposition: A | Discharge status: Home / Self Care | Payer: Self-pay | Source: Ambulatory Visit | Attending: Plastic Surgery

## 2022-01-17 ENCOUNTER — Observation Stay (HOSPITAL_COMMUNITY)
Admission: AD | Admit: 2022-01-17 | Discharge: 2022-01-18 | Disposition: A | Discharge status: Home / Self Care | Payer: PPO | Source: Ambulatory Visit | Attending: Plastic Surgery | Admitting: Plastic Surgery

## 2022-01-17 ENCOUNTER — Other Ambulatory Visit: Payer: Self-pay

## 2022-01-17 ENCOUNTER — Encounter (HOSPITAL_BASED_OUTPATIENT_CLINIC_OR_DEPARTMENT_OTHER): Payer: Self-pay | Admitting: Surgery

## 2022-01-17 ENCOUNTER — Observation Stay (HOSPITAL_COMMUNITY): Payer: PPO

## 2022-01-17 ENCOUNTER — Ambulatory Visit (HOSPITAL_COMMUNITY): Payer: PPO

## 2022-01-17 DIAGNOSIS — I10 Essential (primary) hypertension: Secondary | ICD-10-CM | POA: Diagnosis not present

## 2022-01-17 DIAGNOSIS — Z79899 Other long term (current) drug therapy: Secondary | ICD-10-CM | POA: Diagnosis not present

## 2022-01-17 DIAGNOSIS — D0512 Intraductal carcinoma in situ of left breast: Secondary | ICD-10-CM | POA: Diagnosis not present

## 2022-01-17 DIAGNOSIS — G8918 Other acute postprocedural pain: Secondary | ICD-10-CM | POA: Diagnosis not present

## 2022-01-17 DIAGNOSIS — Z87891 Personal history of nicotine dependence: Secondary | ICD-10-CM | POA: Diagnosis not present

## 2022-01-17 DIAGNOSIS — C50912 Malignant neoplasm of unspecified site of left female breast: Secondary | ICD-10-CM | POA: Diagnosis not present

## 2022-01-17 DIAGNOSIS — C50412 Malignant neoplasm of upper-outer quadrant of left female breast: Principal | ICD-10-CM | POA: Insufficient documentation

## 2022-01-17 DIAGNOSIS — Z421 Encounter for breast reconstruction following mastectomy: Secondary | ICD-10-CM

## 2022-01-17 DIAGNOSIS — F418 Other specified anxiety disorders: Secondary | ICD-10-CM | POA: Diagnosis not present

## 2022-01-17 DIAGNOSIS — Z01818 Encounter for other preprocedural examination: Secondary | ICD-10-CM

## 2022-01-17 DIAGNOSIS — J45909 Unspecified asthma, uncomplicated: Secondary | ICD-10-CM | POA: Insufficient documentation

## 2022-01-17 DIAGNOSIS — I889 Nonspecific lymphadenitis, unspecified: Secondary | ICD-10-CM | POA: Diagnosis not present

## 2022-01-17 DIAGNOSIS — Z171 Estrogen receptor negative status [ER-]: Secondary | ICD-10-CM | POA: Diagnosis not present

## 2022-01-17 DIAGNOSIS — C50919 Malignant neoplasm of unspecified site of unspecified female breast: Secondary | ICD-10-CM

## 2022-01-17 HISTORY — PX: BREAST RECONSTRUCTION WITH PLACEMENT OF TISSUE EXPANDER AND FLEX HD (ACELLULAR HYDRATED DERMIS): SHX6295

## 2022-01-17 HISTORY — PX: PORTACATH PLACEMENT: SHX2246

## 2022-01-17 HISTORY — PX: MASTECTOMY W/ SENTINEL NODE BIOPSY: SHX2001

## 2022-01-17 SURGERY — MASTECTOMY WITH SENTINEL LYMPH NODE BIOPSY
Anesthesia: General | Site: Chest | Laterality: Right

## 2022-01-17 MED ORDER — ONDANSETRON HCL 4 MG/2ML IJ SOLN: 4.0000 mg | Freq: Four times a day (QID) | INTRAMUSCULAR | Status: DC | PRN

## 2022-01-17 MED ORDER — OXYCODONE HCL 5 MG PO TABS: 5.0000 mg | ORAL_TABLET | ORAL | Status: DC | PRN

## 2022-01-17 MED ORDER — EPHEDRINE SULFATE (PRESSORS) 50 MG/ML IJ SOLN
INTRAMUSCULAR | Status: DC | PRN
  Administered 2022-01-17: 5 mg via INTRAVENOUS
  Administered 2022-01-17: 10 mg via INTRAVENOUS
  Administered 2022-01-17: 5 mg via INTRAVENOUS
  Administered 2022-01-17: 10 mg via INTRAVENOUS
  Administered 2022-01-17 (×2): 5 mg via INTRAVENOUS
  Administered 2022-01-17: 10 mg via INTRAVENOUS

## 2022-01-17 MED ORDER — CIPROFLOXACIN IN D5W 400 MG/200ML IV SOLN
400.0000 mg | INTRAVENOUS | Status: AC
  Administered 2022-01-17: 400 mg via INTRAVENOUS

## 2022-01-17 MED ORDER — BUPIVACAINE HCL (PF) 0.25 % IJ SOLN
INTRAMUSCULAR | Status: DC | PRN
  Administered 2022-01-17: 3 mL

## 2022-01-17 MED ORDER — HYDROMORPHONE HCL 1 MG/ML IJ SOLN: 1.0000 mg | INTRAMUSCULAR | Status: DC | PRN

## 2022-01-17 MED ORDER — MAGTRACE LYMPHATIC TRACER
INTRAMUSCULAR | Status: DC | PRN
  Administered 2022-01-17: 2 mL via INTRAMUSCULAR

## 2022-01-17 MED ORDER — DIAZEPAM 2 MG PO TABS
2.0000 mg | ORAL_TABLET | Freq: Two times a day (BID) | ORAL | Status: DC | PRN
  Administered 2022-01-17: 2 mg via ORAL
  Filled 2022-01-17: qty 1

## 2022-01-17 MED ORDER — MIDAZOLAM HCL 2 MG/2ML IJ SOLN
INTRAMUSCULAR | Status: DC | PRN
  Administered 2022-01-17 (×2): 1 mg via INTRAVENOUS

## 2022-01-17 MED ORDER — MIDAZOLAM HCL 2 MG/2ML IJ SOLN
INTRAMUSCULAR | Status: AC
  Filled 2022-01-17: qty 2

## 2022-01-17 MED ORDER — CIPROFLOXACIN IN D5W 400 MG/200ML IV SOLN: 400.0000 mg | INTRAVENOUS | Status: DC

## 2022-01-17 MED ORDER — ALBUMIN HUMAN 5 % IV SOLN
INTRAVENOUS | Status: AC
  Filled 2022-01-17: qty 250

## 2022-01-17 MED ORDER — ACETAMINOPHEN 325 MG PO TABS
325.0000 mg | ORAL_TABLET | Freq: Four times a day (QID) | ORAL | Status: DC
  Administered 2022-01-17 – 2022-01-18 (×3): 325 mg via ORAL
  Filled 2022-01-17 (×3): qty 1

## 2022-01-17 MED ORDER — DIPHENHYDRAMINE HCL 12.5 MG/5ML PO ELIX: 12.5000 mg | ORAL_SOLUTION | Freq: Four times a day (QID) | ORAL | Status: DC | PRN

## 2022-01-17 MED ORDER — EPHEDRINE 5 MG/ML INJ
INTRAVENOUS | Status: AC
  Filled 2022-01-17: qty 5

## 2022-01-17 MED ORDER — CIPROFLOXACIN IN D5W 400 MG/200ML IV SOLN
INTRAVENOUS | Status: AC
  Filled 2022-01-17: qty 200

## 2022-01-17 MED ORDER — SODIUM CHLORIDE 0.9 % IV SOLN
INTRAVENOUS | Status: AC
  Filled 2022-01-17: qty 10

## 2022-01-17 MED ORDER — AMISULPRIDE (ANTIEMETIC) 5 MG/2ML IV SOLN: 10.0000 mg | Freq: Once | INTRAVENOUS | Status: DC | PRN

## 2022-01-17 MED ORDER — PROPOFOL 10 MG/ML IV BOLUS
INTRAVENOUS | Status: AC
  Filled 2022-01-17: qty 20

## 2022-01-17 MED ORDER — POLYETHYLENE GLYCOL 3350 17 G PO PACK: 17.0000 g | PACK | Freq: Every day | ORAL | Status: DC | PRN

## 2022-01-17 MED ORDER — FENTANYL CITRATE (PF) 100 MCG/2ML IJ SOLN
INTRAMUSCULAR | Status: AC
  Filled 2022-01-17: qty 2

## 2022-01-17 MED ORDER — ARTIFICIAL TEARS OPHTHALMIC OINT
TOPICAL_OINTMENT | OPHTHALMIC | Status: DC | PRN
  Administered 2022-01-17: 1 via OPHTHALMIC

## 2022-01-17 MED ORDER — HEPARIN SOD (PORK) LOCK FLUSH 100 UNIT/ML IV SOLN
INTRAVENOUS | Status: DC | PRN
  Administered 2022-01-17: 500 [IU] via INTRAVENOUS

## 2022-01-17 MED ORDER — FENTANYL CITRATE (PF) 100 MCG/2ML IJ SOLN
25.0000 ug | INTRAMUSCULAR | Status: DC | PRN
  Administered 2022-01-17 (×2): 50 ug via INTRAVENOUS

## 2022-01-17 MED ORDER — DEXAMETHASONE SODIUM PHOSPHATE 10 MG/ML IJ SOLN
INTRAMUSCULAR | Status: DC | PRN
  Administered 2022-01-17 (×2): 5 mg via INTRAVENOUS

## 2022-01-17 MED ORDER — DEXAMETHASONE SODIUM PHOSPHATE 10 MG/ML IJ SOLN
INTRAMUSCULAR | Status: AC
  Filled 2022-01-17: qty 1

## 2022-01-17 MED ORDER — ROCURONIUM BROMIDE 10 MG/ML (PF) SYRINGE
PREFILLED_SYRINGE | INTRAVENOUS | Status: AC
  Filled 2022-01-17: qty 10

## 2022-01-17 MED ORDER — ONDANSETRON HCL 4 MG/2ML IJ SOLN
INTRAMUSCULAR | Status: DC | PRN
  Administered 2022-01-17: 4 mg via INTRAVENOUS

## 2022-01-17 MED ORDER — DEXMEDETOMIDINE HCL IN NACL 80 MCG/20ML IV SOLN
INTRAVENOUS | Status: AC
  Filled 2022-01-17: qty 20

## 2022-01-17 MED ORDER — LACTATED RINGERS IV SOLN: INTRAVENOUS | Status: DC

## 2022-01-17 MED ORDER — FENTANYL CITRATE (PF) 100 MCG/2ML IJ SOLN
100.0000 ug | Freq: Once | INTRAMUSCULAR | Status: AC
  Administered 2022-01-17: 100 ug via INTRAVENOUS

## 2022-01-17 MED ORDER — GLYCOPYRROLATE 0.2 MG/ML IJ SOLN
INTRAMUSCULAR | Status: DC | PRN
  Administered 2022-01-17: .1 mg via INTRAVENOUS

## 2022-01-17 MED ORDER — BUPIVACAINE LIPOSOME 1.3 % IJ SUSP
INTRAMUSCULAR | Status: DC | PRN
  Administered 2022-01-17: 10 mL

## 2022-01-17 MED ORDER — ALBUMIN HUMAN 5 % IV SOLN: INTRAVENOUS | Status: DC | PRN

## 2022-01-17 MED ORDER — ACETAMINOPHEN 500 MG PO TABS
ORAL_TABLET | ORAL | Status: AC
  Filled 2022-01-17: qty 2

## 2022-01-17 MED ORDER — PROPOFOL 500 MG/50ML IV EMUL
INTRAVENOUS | Status: AC
  Filled 2022-01-17: qty 50

## 2022-01-17 MED ORDER — PHENYLEPHRINE HCL (PRESSORS) 10 MG/ML IV SOLN
INTRAVENOUS | Status: DC | PRN
  Administered 2022-01-17 (×2): 80 ug via INTRAVENOUS

## 2022-01-17 MED ORDER — ARTIFICIAL TEARS OPHTHALMIC OINT
TOPICAL_OINTMENT | OPHTHALMIC | Status: AC
  Filled 2022-01-17: qty 3.5

## 2022-01-17 MED ORDER — IBUPROFEN 200 MG PO TABS
400.0000 mg | ORAL_TABLET | Freq: Four times a day (QID) | ORAL | Status: DC
  Administered 2022-01-17 – 2022-01-18 (×2): 400 mg via ORAL
  Filled 2022-01-17 (×2): qty 2

## 2022-01-17 MED ORDER — ONDANSETRON HCL 4 MG/2ML IJ SOLN
INTRAMUSCULAR | Status: AC
  Filled 2022-01-17: qty 2

## 2022-01-17 MED ORDER — KCL IN DEXTROSE-NACL 20-5-0.45 MEQ/L-%-% IV SOLN
INTRAVENOUS | Status: DC
  Filled 2022-01-17: qty 1000

## 2022-01-17 MED ORDER — GLYCOPYRROLATE PF 0.2 MG/ML IJ SOSY
PREFILLED_SYRINGE | INTRAMUSCULAR | Status: AC
  Filled 2022-01-17: qty 1

## 2022-01-17 MED ORDER — CELECOXIB 200 MG PO CAPS: 200.0000 mg | ORAL_CAPSULE | Freq: Once | ORAL | Status: DC

## 2022-01-17 MED ORDER — LIDOCAINE 2% (20 MG/ML) 5 ML SYRINGE
INTRAMUSCULAR | Status: AC
  Filled 2022-01-17: qty 5

## 2022-01-17 MED ORDER — PROPOFOL 10 MG/ML IV BOLUS
INTRAVENOUS | Status: DC | PRN
  Administered 2022-01-17: 150 mg via INTRAVENOUS

## 2022-01-17 MED ORDER — BUPIVACAINE HCL (PF) 0.5 % IJ SOLN
INTRAMUSCULAR | Status: DC | PRN
  Administered 2022-01-17: 15 mL

## 2022-01-17 MED ORDER — 0.9 % SODIUM CHLORIDE (POUR BTL) OPTIME
TOPICAL | Status: DC | PRN
  Administered 2022-01-17: 1000 mL

## 2022-01-17 MED ORDER — DIPHENHYDRAMINE HCL 50 MG/ML IJ SOLN
INTRAMUSCULAR | Status: AC
  Filled 2022-01-17: qty 1

## 2022-01-17 MED ORDER — FENTANYL CITRATE (PF) 100 MCG/2ML IJ SOLN
INTRAMUSCULAR | Status: DC | PRN
  Administered 2022-01-17 (×2): 50 ug via INTRAVENOUS
  Administered 2022-01-17: 25 ug via INTRAVENOUS
  Administered 2022-01-17: 50 ug via INTRAVENOUS
  Administered 2022-01-17: 25 ug via INTRAVENOUS

## 2022-01-17 MED ORDER — DIPHENHYDRAMINE HCL 50 MG/ML IJ SOLN
INTRAMUSCULAR | Status: DC | PRN
  Administered 2022-01-17: 25 mg via INTRAVENOUS

## 2022-01-17 MED ORDER — ACETAMINOPHEN 500 MG PO TABS
1000.0000 mg | ORAL_TABLET | ORAL | Status: AC
  Administered 2022-01-17: 1000 mg via ORAL

## 2022-01-17 MED ORDER — DIPHENHYDRAMINE HCL 50 MG/ML IJ SOLN: 12.5000 mg | Freq: Four times a day (QID) | INTRAMUSCULAR | Status: DC | PRN

## 2022-01-17 MED ORDER — ONDANSETRON 4 MG PO TBDP: 4.0000 mg | ORAL_TABLET | Freq: Four times a day (QID) | ORAL | Status: DC | PRN

## 2022-01-17 MED ORDER — HEPARIN (PORCINE) IN NACL 2-0.9 UNITS/ML
INTRAMUSCULAR | Status: AC | PRN
  Administered 2022-01-17: 500 mL

## 2022-01-17 MED ORDER — PROMETHAZINE HCL 25 MG/ML IJ SOLN: 6.2500 mg | INTRAMUSCULAR | Status: DC | PRN

## 2022-01-17 MED ORDER — MIDAZOLAM HCL 2 MG/2ML IJ SOLN
2.0000 mg | Freq: Once | INTRAMUSCULAR | Status: AC
  Administered 2022-01-17: 2 mg via INTRAVENOUS

## 2022-01-17 MED ORDER — SENNA 8.6 MG PO TABS: 1.0000 | ORAL_TABLET | Freq: Two times a day (BID) | ORAL | Status: DC

## 2022-01-17 SURGICAL SUPPLY — 93 items
ADH SKN CLS APL DERMABOND .7 (GAUZE/BANDAGES/DRESSINGS) ×4
APL PRP STRL LF DISP 70% ISPRP (MISCELLANEOUS) ×2
APPLIER CLIP 9.375 MED OPEN (MISCELLANEOUS)
APR CLP MED 9.3 20 MLT OPN (MISCELLANEOUS)
BAG DECANTER FOR FLEXI CONT (MISCELLANEOUS) ×3 IMPLANT
BINDER BREAST LRG (GAUZE/BANDAGES/DRESSINGS) IMPLANT
BINDER BREAST MEDIUM (GAUZE/BANDAGES/DRESSINGS) IMPLANT
BINDER BREAST XLRG (GAUZE/BANDAGES/DRESSINGS) IMPLANT
BINDER BREAST XXLRG (GAUZE/BANDAGES/DRESSINGS) IMPLANT
BIOPATCH RED 1 DISK 7.0 (GAUZE/BANDAGES/DRESSINGS) ×3 IMPLANT
BLADE CLIPPER SURG (BLADE) IMPLANT
BLADE HEX COATED 2.75 (ELECTRODE) ×3 IMPLANT
BLADE SURG 15 STRL LF DISP TIS (BLADE) ×2 IMPLANT
BLADE SURG 15 STRL SS (BLADE) ×3
BNDG GAUZE DERMACEA FLUFF (GAUZE/BANDAGES/DRESSINGS) ×2
BNDG GAUZE DERMACEA FLUFF 4 (GAUZE/BANDAGES/DRESSINGS) ×4 IMPLANT
BNDG GZE DERMACEA 4 6PLY (GAUZE/BANDAGES/DRESSINGS) ×4
CANISTER SUCT 1200ML W/VALVE (MISCELLANEOUS) ×3 IMPLANT
CHLORAPREP W/TINT 26 (MISCELLANEOUS) ×3 IMPLANT
CLIP APPLIE 9.375 MED OPEN (MISCELLANEOUS) IMPLANT
COVER BACK TABLE 60X90IN (DRAPES) ×3 IMPLANT
COVER MAYO STAND STRL (DRAPES) ×3 IMPLANT
COVER PROBE W GEL 5X96 (DRAPES) ×3 IMPLANT
DERMABOND ADVANCED (GAUZE/BANDAGES/DRESSINGS) ×6
DERMABOND ADVANCED .7 DNX12 (GAUZE/BANDAGES/DRESSINGS) ×4 IMPLANT
DRAIN CHANNEL 19F RND (DRAIN) ×3 IMPLANT
DRAPE C-ARM 42X72 X-RAY (DRAPES) ×3 IMPLANT
DRAPE LAPAROSCOPIC ABDOMINAL (DRAPES) ×3 IMPLANT
DRAPE UTILITY XL STRL (DRAPES) ×3 IMPLANT
DRSG OPSITE POSTOP 4X6 (GAUZE/BANDAGES/DRESSINGS) IMPLANT
DRSG PAD ABDOMINAL 8X10 ST (GAUZE/BANDAGES/DRESSINGS) ×6 IMPLANT
ELECT BLADE 4.0 EZ CLEAN MEGAD (MISCELLANEOUS) ×3
ELECT REM PT RETURN 9FT ADLT (ELECTROSURGICAL) ×3
ELECTRODE BLDE 4.0 EZ CLN MEGD (MISCELLANEOUS) ×2 IMPLANT
ELECTRODE REM PT RTRN 9FT ADLT (ELECTROSURGICAL) ×2 IMPLANT
EVACUATOR SILICONE 100CC (DRAIN) ×3 IMPLANT
FUNNEL KELLER 2 DISP (MISCELLANEOUS) IMPLANT
GAUZE 4X4 16PLY ~~LOC~~+RFID DBL (SPONGE) ×3 IMPLANT
GAUZE SPONGE 4X4 12PLY STRL LF (GAUZE/BANDAGES/DRESSINGS) IMPLANT
GLOVE BIO SURGEON STRL SZ 6.5 (GLOVE) ×6 IMPLANT
GLOVE SURG SIGNA 7.5 PF LTX (GLOVE) ×3 IMPLANT
GOWN STRL REUS W/ TWL LRG LVL3 (GOWN DISPOSABLE) ×6 IMPLANT
GOWN STRL REUS W/ TWL XL LVL3 (GOWN DISPOSABLE) ×2 IMPLANT
GOWN STRL REUS W/TWL LRG LVL3 (GOWN DISPOSABLE) ×9
GOWN STRL REUS W/TWL XL LVL3 (GOWN DISPOSABLE) ×3
HEMOSTAT SURGICEL 2X14 (HEMOSTASIS) IMPLANT
IMPL EXPANDER BREAST 535CC (Breast) IMPLANT
IMPLANT EXPANDER BREAST 535CC (Breast) ×3 IMPLANT
IV KIT MINILOC 20X1 SAFETY (NEEDLE) IMPLANT
IV NS 1000ML (IV SOLUTION)
IV NS 1000ML BAXH (IV SOLUTION) IMPLANT
IV NS 500ML (IV SOLUTION) ×3
IV NS 500ML BAXH (IV SOLUTION) ×2 IMPLANT
KIT FILL ASEPTIC TRANSFER (MISCELLANEOUS) IMPLANT
KIT PORT POWER 8FR ISP CVUE (Port) ×1 IMPLANT
LIGHT WAVEGUIDE WIDE FLAT (MISCELLANEOUS) ×3 IMPLANT
NDL HYPO 25X1 1.5 SAFETY (NEEDLE) ×4 IMPLANT
NDL SAFETY ECLIPSE 18X1.5 (NEEDLE) ×2 IMPLANT
NEEDLE HYPO 18GX1.5 SHARP (NEEDLE) ×3
NEEDLE HYPO 25X1 1.5 SAFETY (NEEDLE) ×6 IMPLANT
NS IRRIG 1000ML POUR BTL (IV SOLUTION) ×3 IMPLANT
PACK BASIN DAY SURGERY FS (CUSTOM PROCEDURE TRAY) ×3 IMPLANT
PAD FOAM SILICONE BACKED (GAUZE/BANDAGES/DRESSINGS) IMPLANT
PENCIL SMOKE EVACUATOR (MISCELLANEOUS) ×3 IMPLANT
PIN SAFETY STERILE (MISCELLANEOUS) ×3 IMPLANT
SLEEVE SCD COMPRESS KNEE MED (STOCKING) ×3 IMPLANT
SPIKE FLUID TRANSFER (MISCELLANEOUS) IMPLANT
SPONGE T-LAP 18X18 ~~LOC~~+RFID (SPONGE) ×6 IMPLANT
STRIP SUTURE WOUND CLOSURE 1/2 (MISCELLANEOUS) IMPLANT
SUT ETHILON 3 0 PS 1 (SUTURE) ×3 IMPLANT
SUT MNCRL AB 4-0 PS2 18 (SUTURE) ×3 IMPLANT
SUT MON AB 3-0 SH 27 (SUTURE) ×3
SUT MON AB 3-0 SH27 (SUTURE) ×2 IMPLANT
SUT MON AB 5-0 PS2 18 (SUTURE) IMPLANT
SUT PDS 3-0 CT2 (SUTURE)
SUT PDS AB 2-0 CT2 27 (SUTURE) IMPLANT
SUT PDS II 3-0 CT2 27 ABS (SUTURE) IMPLANT
SUT PROLENE 2 0 SH DA (SUTURE) ×3 IMPLANT
SUT SILK 2 0 TIES 17X18 (SUTURE)
SUT SILK 2-0 18XBRD TIE BLK (SUTURE) IMPLANT
SUT SILK 3 0 PS 1 (SUTURE) ×3 IMPLANT
SUT VIC AB 3-0 SH 27 (SUTURE) ×3
SUT VIC AB 3-0 SH 27X BRD (SUTURE) ×2 IMPLANT
SYR BULB EAR ULCER 3OZ GRN STR (SYRINGE) ×3 IMPLANT
SYR BULB IRRIG 60ML STRL (SYRINGE) ×3 IMPLANT
SYR CONTROL 10ML LL (SYRINGE) ×6 IMPLANT
TISSUE FLEXHD PERF PLIAB 8X16 (Tissue) ×1 IMPLANT
TOWEL GREEN STERILE FF (TOWEL DISPOSABLE) ×6 IMPLANT
TRACER MAGTRACE VIAL (MISCELLANEOUS) IMPLANT
TRAY DSU PREP LF (CUSTOM PROCEDURE TRAY) ×3 IMPLANT
TUBE CONNECTING 20X1/4 (TUBING) ×3 IMPLANT
UNDERPAD 30X36 HEAVY ABSORB (UNDERPADS AND DIAPERS) ×6 IMPLANT
YANKAUER SUCT BULB TIP NO VENT (SUCTIONS) ×3 IMPLANT

## 2022-01-17 NOTE — Progress Notes (Signed)
Assisted Dr. Singer with left, pectoralis, ultrasound guided block. Side rails up, monitors on throughout procedure. See vital signs in flow sheet. Tolerated Procedure well. 

## 2022-01-17 NOTE — Anesthesia Procedure Notes (Signed)
Procedure Name: LMA Insertion Date/Time: 01/17/2022 1:30 PM  Performed by: Collier Bullock, CRNAPre-anesthesia Checklist: Patient identified, Emergency Drugs available, Suction available and Patient being monitored Patient Re-evaluated:Patient Re-evaluated prior to induction Oxygen Delivery Method: Circle system utilized Preoxygenation: Pre-oxygenation with 100% oxygen Induction Type: IV induction Ventilation: Mask ventilation without difficulty LMA: LMA inserted LMA Size: 4.0 Number of attempts: 1 Tube secured with: Tape Dental Injury: Teeth and Oropharynx as per pre-operative assessment

## 2022-01-17 NOTE — Interval H&P Note (Signed)
History and Physical Interval Note:  01/17/2022 11:28 AM  Kaitlyn Santana  has presented today for surgery, with the diagnosis of LEFT BREAST CANCER.  The various methods of treatment have been discussed with the patient and family. After consideration of risks, benefits and other options for treatment, the patient has consented to  Procedure(s) with comments: LEFT TOTAL MASTECTOMY WITH SENTINEL NODE BIOPSY (Left) - LMA INSERTION PORT-A-CATH (N/A) IMMEDIATE LEFT BREAST RECONSTRUCTION WITH PLACEMENT OF TISSUE EXPANDER AND FLEX HD (ACELLULAR HYDRATED DERMIS) (Left) as a surgical intervention.  The patient's history has been reviewed, patient examined, no change in status, stable for surgery.  I have reviewed the patient's chart and labs.  Questions were answered to the patient's satisfaction.     [name]

## 2022-01-17 NOTE — Op Note (Signed)
Kaitlyn Santana 01/17/2022   Pre-op Diagnosis: LEFT BREAST CANCER     Post-op Diagnosis: SAME  Procedure(s): LEFT BREAST TOTAL MASTECTOMY LEFT AXILLARY SENTINEL LYMPH NODE BIOPSY RIGHT SUBCLAVIAN PORT A CATH INSERTION (8 FR) INJECTION OF MAGTRACE FOR LYMPH NODE MAPPING  Surgeon: Coralie Keens, MD  Anesthesia: General  Staff:  Circulator: Lauris Chroman, RN Scrub Person: Lorenza Burton, CST; Patric Dykes, RN Circulator Assistant: Anson Crofts, RN Surgical/MD Assistant: Clance Boll, PA-C  Estimated Blood Loss: Minimal               Specimens: SENT TO PATH  Indications: This is a 65 year old female has had a previous left breast cancer in 2005 where she underwent a lumpectomy with sentinel node biopsy and radiation therapy.  She now has a new invasive breast cancer in the same breast.  The decision was made to proceed with a left mastectomy with an attempted sentinel node biopsy and immediate reconstruction.  She is HER2 positive so also will undergo Port-A-Cath insertion.  Procedure: The patient was brought to operating identifies a correct patient.  She is placed upon the operating table and general anesthesia was induced.  I next injected mag trace underneath the left nipple areolar complex and massaged the left breast.   Her right neck and chest were then prepped and draped in usual sterile fashion.  With the patient in the Trendelenburg position I anesthetized the right skin and chest and clavicle with Marcaine.  I then used the introducer needle to cannulate the right subclavian vein.  I passed a wire through the needle into the central venous system under fluoroscopy.  I then made an incision on the chest at the wire site with a scalpel.  I dissected down to the subtenons tissue with the cautery and then created a pocket for the port.  An 8 French Clearview port was brought to the field.  The port and catheter were flushed appropriately.  The catheter was  attached to the port.  I passed a venous dilator and introducer sheath over the wire and into the central venous system.  I then remove the wire and dilator.  The port was placed into the pocket.  I cut the catheter in appropriate length.  The catheter was then fed down the peel-away sheath.  The sheath was then peeled away leaving the catheter in central venous system.  It was confirmed to be in the superior vena cava under fluoroscopy.  I accessed the port and good flush and return were demonstrated.  I then instilled concentrated heparin solution into the port.  The port was sewn to the chest wall with a single 2-0 Prolene suture.  I then closed the subcutaneous tissue with interrupted 3-0 Vicryl sutures and closed the skin with a running 4-0 Monocryl.  Dermabond was then applied.  The patient's bilateral breast, chest, and axilla were then prepped and draped again in the usual sterile fashion.  I next made elliptical incision transversely across the chest with a scalpel incorporating the left nipple areolar complex.  I took the dissection down into the subtenons tissue circumferentially with the electrocautery.  I then dissected the superior flap going toward the clavicle and then down to the chest wall.  I dissected medially toward the sternum.  I then dissected the inferior skin flap going down to the inframammary ridge.  I then took the dissection of both skin flaps laterally toward the axilla.  I then slowly dissected the breast  tissue off of the chest wall dissecting medial to lateral removing the breast off of the pectoralis fascia.  I then completed the mastectomy just past the pectoralis muscle toward the axilla.  I then marked the mastectomy specimen laterally with a suture.  The breast was sent to pathology for evaluation.  It was difficult to tell with the mag trace probe but there may have been a lymph node in the breast tissue.  I evaluated the axilla.  I dissected down to the deep axillary tissue  with the cautery.  The patient has significant scarring from her previous lymph node biopsies and surgical clips were identified.  There was very weak uptake and MAC trace in the axilla but identified what appeared to be several lymph nodes which I then excised with the cautery and surgical clips.  These were sent to pathology for evaluation.  At this point hemostasis appeared to be achieved.  I irrigated the axilla with saline.  Again hemostasis peer to be achieved.  At this point, Dr. Marla Roe started her portion of the procedure doing the immediate reconstruction.            Coralie Keens   Date: 01/17/2022  Time: 2:52 PM

## 2022-01-17 NOTE — Op Note (Signed)
Op report    DATE OF OPERATION:  01/17/2022  LOCATION: [facility]  SURGICAL DIVISION: Plastic Surgery  PREOPERATIVE DIAGNOSES:  1. Left Breast cancer upper outer quadrant.    POSTOPERATIVE DIAGNOSES:  1.Left Breast cancer upper outer quadrant.   PROCEDURE:  1. Left immediate breast reconstruction with placement of Acellular Dermal Matrix and tissue expanders.  SURGEON: Berdena Cisek Sanger Taronda Comacho, DO  ASSISTANT: Donnamarie Rossetti, PA  ANESTHESIA:  General.   COMPLICATIONS: None.   IMPLANTS: Left - Mentor 535 cc. Ref #[id number].  100 cc of injectable saline placed in the expander. Acellular Dermal Matrix flex HD 8 x 16 cm  INDICATIONS FOR PROCEDURE:  The patient, Kaitlyn Santana, is a 66 y.o. female born on 31-Aug-1955, is here for  immediate first stage breast reconstruction with placement of left tissue expander and Acellular dermal matrix. MRN: [id number]  CONSENT:  Informed consent was obtained directly from the patient. Risks, benefits and alternatives were fully discussed. Specific risks including but not limited to bleeding, infection, hematoma, seroma, scarring, pain, implant infection, implant extrusion, capsular contracture, asymmetry, wound healing problems, and need for further surgery were all discussed. The patient did have an ample opportunity to have her questions answered to her satisfaction.   DESCRIPTION OF PROCEDURE:  The patient was taken to the operating room by the general surgery team. SCDs were placed and IV antibiotics were given. The patient's chest was prepped and draped in a sterile fashion. A time out was performed and the implants to be used were identified.  A left mastectomy was performed.  Once the general surgery team had completed their portion of the case the patient was rendered to the plastic and reconstructive surgery team.  Left:  The pectoralis major muscle was lifted from the chest wall with release of the lateral edge and  lateral inframammary fold.  The pocket was irrigated with antibiotic solution and hemostasis was achieved with electrocautery.  The ADM was then prepared according to the manufacture guidelines and slits placed to help with postoperative fluid management.  The ADM was then sutured to the inferior and lateral edge of the inframammary fold with 2-0 PDS starting with an interrupted stitch and then a running stitch.  The lateral portion was sutured to with interrupted sutures after the expander was placed.  The expander was prepared according to the manufacture guidelines, the air evacuated and then it was placed under the ADM and pectoralis major muscle.  The inferior and lateral tabs were used to secure the expander to the chest wall with 2-0 PDS.  The drain was placed at the inframammary fold over the ADM and secured to the skin with 3-0 Silk.  The deep layers were closed with 3-0 PDS followed by 3-0 Monocryl.  Dermabond was applied.  The ABDs and breast binder were placed.  The patient tolerated the procedure well and there were no complications.  The patient was allowed to wake from anesthesia and taken to the recovery room in satisfactory condition.  The skin was very tight.   The advanced practice practitioner (APP) assisted throughout the case.  The APP was essential in retraction and counter traction when needed to make the case progress smoothly.  This retraction and assistance made it possible to see the tissue plans for the procedure.  The assistance was needed for blood control, tissue re-approximation and assisted with closure of the incision site.

## 2022-01-17 NOTE — Anesthesia Procedure Notes (Signed)
Anesthesia Regional Block: Pectoralis block   Pre-Anesthetic Checklist: , timeout performed,  Correct Patient, Correct Site, Correct Laterality,  Correct Procedure, Correct Position, site marked,  Risks and benefits discussed,  Surgical consent,  Pre-op evaluation,  At surgeon's request and post-op pain management  Laterality: Left  Prep: chloraprep       Needles:  Injection technique: Single-shot  Needle Type: Echogenic Stimulator Needle     Needle Length: 10cm  Needle Gauge: 21     Additional Needles:   Procedures:,,,, ultrasound used (permanent image in chart),,    Narrative:  Start time: 01/17/2022 12:19 PM End time: 01/17/2022 12:29 PM Injection made incrementally with aspirations every 5 mL.  Performed by: Personally

## 2022-01-17 NOTE — Interval H&P Note (Signed)
History and Physical Interval Note:no change in H and P  01/17/2022 12:03 PM  Kaitlyn Santana  has presented today for surgery, with the diagnosis of LEFT BREAST CANCER.  The various methods of treatment have been discussed with the patient and family. After consideration of risks, benefits and other options for treatment, the patient has consented to  Procedure(s) with comments: LEFT TOTAL MASTECTOMY WITH SENTINEL NODE BIOPSY (Left) - LMA INSERTION PORT-A-CATH (N/A) IMMEDIATE LEFT BREAST RECONSTRUCTION WITH PLACEMENT OF TISSUE EXPANDER AND FLEX HD (ACELLULAR HYDRATED DERMIS) (Left) as a surgical intervention.  The patient's history has been reviewed, patient examined, no change in status, stable for surgery.  I have reviewed the patient's chart and labs.  Questions were answered to the patient's satisfaction.     Coralie Keens

## 2022-01-17 NOTE — Discharge Instructions (Addendum)
INSTRUCTIONS FOR AFTER BREAST SURGERY   You will likely have some questions about what to expect following your operation.  The following information will help you and your family understand what to expect when you are discharged from the hospital.  Following these guidelines will help ensure a smooth recovery and reduce risks of complications.  Postoperative instructions include information on: diet, wound care, medications and physical activity.  AFTER SURGERY Expect to go home after the procedure.  In some cases, you may need to spend one night in the hospital for observation.  DIET Breast surgery does not require a specific diet.  However, the healthier you eat the better your body can start healing. It is important to increasing your protein intake.  This means limiting the foods with sugar and carbohydrates.  Focus on vegetables and some meat.  If you have any liposuction during your procedure be sure to drink water.  If your urine is bright yellow, then it is concentrated, and you need to drink more water.  As a general rule after surgery, you should have 8 ounces of water every hour while awake.  If you find you are persistently nauseated or unable to take in liquids let us know.  NO TOBACCO USE or EXPOSURE.  This will slow your healing process and increase the risk of a wound.  WOUND CARE Leave the binder on for 3 days . Use fragrance free soap.   After 3 days you can remove the binder to shower. Once dry apply ACE wrap, binder or sports bra.  Use a mild soap like Dial, Dove and [place]. You may have Topifoam or Lipofoam on.  It is soft and spongy and helps keep you from getting creases if you have liposuction.  This can be removed before the shower and then replaced.  If you need more it is available on Amazon (Lipofoam). If you have steri-strips / tape directly attached to your skin leave them in place. It is OK to get these wet.   No baths, pools or hot tubs for four weeks. We close your  incision to leave the smallest and best-looking scar. No ointment or creams on your incisions until given the go ahead.  Especially not Neosporin (Too many skin reactions with this one).  A few weeks after surgery you can use Mederma and start massaging the scar. We ask you to wear your binder or sports bra for the first 6 weeks around the clock, including while sleeping. This provides added comfort and helps reduce the fluid accumulation at the surgery site.  ACTIVITY No heavy lifting until cleared by the doctor.  This usually means no more than a half-gallon of milk.  It is OK to walk and climb stairs. In fact, moving your legs is very important to decrease your risk of a blood clot.  It will also help keep you from getting deconditioned.  Every 1 to 2 hours get up and walk for 5 minutes. This will help with a quicker recovery back to normal.  Let pain be your guide so you don't do too much.  This is not the time for spring cleaning and don't plan on taking care of anyone else.  This time is for you to recover,  You will be more comfortable if you sleep and rest with your head elevated either with a few pillows under you or in a recliner.  No stomach sleeping for a three months.  WORK Everyone returns to work at different times.  As a rough guide, most people take at least 1 - 2 weeks off prior to returning to work. If you need documentation for your job, bring the forms to your postoperative follow up visit.  DRIVING Arrange for someone to bring you home from the hospital.  You may be able to drive a few days after surgery but not while taking any narcotics or valium.  BOWEL MOVEMENTS Constipation can occur after anesthesia and while taking pain medication.  It is important to stay ahead for your comfort.  We recommend taking Milk of Magnesia (2 tablespoons; twice a day) while taking the pain pills.  MEDICATIONS You may be prescribed should start after surgery At your preoperative visit for you  history and physical you may have been given the following medications: An antibiotic: Start this medication when you get home and take according to the instructions on the bottle. Zofran 4 mg:  This is to treat nausea and vomiting.  You can take this every 6 hours as needed and only if needed. Valium 2 mg: This is for muscle tightness if you have an implant or expander. This will help relax your muscle which also helps with pain control.  This can be taken every 12 hours as needed. Don't drive after taking this medication. Norco (hydrocodone/acetaminophen) 5/325 mg:  This is only to be used after you have taken the motrin or the tylenol. Every 8 hours as needed.   Over the counter Medication to take: Ibuprofen (Motrin) 600 mg:  Take this every 6 hours.  If you have additional pain then take 500 mg of the tylenol every 8 hours.  Only take the Norco after you have tried these two. Miralax or stool softener of choice: Take this according to the bottle if you take the [place] Call your surgeon's office if any of the following occur: Fever 101 degrees F or greater Excessive bleeding or fluid from the incision site. Pain that increases over time without aid from the medications Redness, warmth, or pus draining from incision sites Persistent nausea or inability to take in liquids Severe misshapen area that underwent the operation.  Here are some resources:  Plastic surgery website: https://www.plasticsurgery.org/for-medical-professionals/education-and-resources/publications/breast-reconstruction-magazine Breast Reconstruction Awareness Campaign:  [employer]nz Plastic surgery Implant information:  https://www.plasticsurgery.org/patient-safety/breast-implant-safety  About my Jackson-Pratt Bulb Drain  What is a Jackson-Pratt bulb? A Jackson-Pratt is a soft, round device used to collect drainage. It is connected to a long, thin drainage catheter, which is held in  place by one or two small stiches near your surgical incision site. When the bulb is squeezed, it forms a vacuum, forcing the drainage to empty into the bulb.  Emptying the Jackson-Pratt bulb- To empty the bulb: 1. Release the plug on the top of the bulb. 2. Pour the bulb's contents into a measuring container which your nurse will provide. 3. Record the time emptied and amount of drainage. Empty the drain(s) as often as your     doctor or nurse recommends.  Date                  Time                    Amount (Drain 1)                 Amount (Drain 2)  _____________________________________________________________________  _____________________________________________________________________  _____________________________________________________________________  _____________________________________________________________________  _____________________________________________________________________  _____________________________________________________________________  _____________________________________________________________________  _____________________________________________________________________  Squeezing the Jackson-Pratt Bulb- To squeeze the bulb:  1. Make sure the plug at the top of the bulb is open. 2. Squeeze the bulb tightly in your fist. You will hear air squeezing from the bulb. 3. Replace the plug while the bulb is squeezed. 4. Use a safety pin to attach the bulb to your clothing. This will keep the catheter from     pulling at the bulb insertion site.  When to call your doctor- Call your doctor if: Drain site becomes red, swollen or hot. You have a fever greater than 101 degrees F. There is oozing at the drain site. Drain falls out (apply a guaze bandage over the drain hole and secure it with tape). Drainage increases daily not related to activity patterns. (You will usually have more drainage when you are active than when you are resting.) Drainage has  a bad odor.

## 2022-01-17 NOTE — Transfer of Care (Signed)
Immediate Anesthesia Transfer of Care Note  Patient: Kaitlyn Santana  Procedure(s) Performed: LEFT TOTAL MASTECTOMY WITH SENTINEL NODE BIOPSY (Left: Breast) INSERTION PORT-A-CATH (Right: Chest) IMMEDIATE LEFT BREAST RECONSTRUCTION WITH PLACEMENT OF TISSUE EXPANDER AND FLEX HD (ACELLULAR HYDRATED DERMIS) (Left: Breast)  Patient Location: PACU  Anesthesia Type:GA combined with regional for post-op pain  Level of Consciousness: awake and confused  Airway & Oxygen Therapy: Patient Spontanous Breathing and Patient connected to face mask oxygen  Post-op Assessment: Report given to RN and Post -op Vital signs reviewed and stable  Post vital signs: Reviewed and stable  Pt with some emergence delirium-Dr. Tobias Alexander notified and at bedside Last Vitals:  BP: 139/87 Vitals Value Taken Time  BP    Temp    Pulse 97 01/17/22 1617  Resp 17 01/17/22 1617  SpO2 95 % 01/17/22 1617  Vitals shown include unvalidated device data.  Last Pain:  Vitals:   01/17/22 1055  TempSrc: Oral  PainSc: 0-No pain      Patients Stated Pain Goal: 3 (61/22/44 9753)  Complications: No notable events documented.

## 2022-01-17 NOTE — Interval H&P Note (Signed)
History and Physical Interval Note:  01/17/2022 11:28 AM  Kaitlyn Santana  has presented today for surgery, with the diagnosis of LEFT BREAST CANCER.  The various methods of treatment have been discussed with the patient and family. After consideration of risks, benefits and other options for treatment, the patient has consented to  Procedure(s) with comments: LEFT TOTAL MASTECTOMY WITH SENTINEL NODE BIOPSY (Left) - LMA INSERTION PORT-A-CATH (N/A) IMMEDIATE LEFT BREAST RECONSTRUCTION WITH PLACEMENT OF TISSUE EXPANDER AND FLEX HD (ACELLULAR HYDRATED DERMIS) (Left) as a surgical intervention.  The patient's history has been reviewed, patient examined, no change in status, stable for surgery.  I have reviewed the patient's chart and labs.  Questions were answered to the patient's satisfaction.     Loel Lofty Ayen Viviano

## 2022-01-17 NOTE — Anesthesia Preprocedure Evaluation (Addendum)
Anesthesia Evaluation  Patient identified by MRN, date of birth, ID band Patient awake    Reviewed: Allergy & Precautions, NPO status , Patient's Chart, lab work & pertinent test results  History of Anesthesia Complications Negative for: history of anesthetic complications  Airway Mallampati: III  TM Distance: >3 FB Neck ROM: Full    Dental no notable dental hx. (+) Dental Advisory Given   Pulmonary neg pulmonary ROS, former smoker,    Pulmonary exam normal        Cardiovascular hypertension, Pt. on home beta blockers Normal cardiovascular exam     Neuro/Psych PSYCHIATRIC DISORDERS Anxiety Depression negative neurological ROS     GI/Hepatic Neg liver ROS, GERD  ,IBS   Endo/Other  negative endocrine ROS  Renal/GU negative Renal ROS     Musculoskeletal negative musculoskeletal ROS (+)   Abdominal   Peds  Hematology negative hematology ROS (+)   Anesthesia Other Findings   Reproductive/Obstetrics                            Anesthesia Physical Anesthesia Plan  ASA: 3  Anesthesia Plan: General   Post-op Pain Management: Regional block*, Celebrex PO (pre-op)* and Tylenol PO (pre-op)*   Induction: Intravenous  PONV Risk Score and Plan: Ondansetron, Dexamethasone, Midazolam and TIVA  Airway Management Planned: LMA  Additional Equipment:   Intra-op Plan:   Post-operative Plan: Extubation in OR  Informed Consent: I have reviewed the patients History and Physical, chart, labs and discussed the procedure including the risks, benefits and alternatives for the proposed anesthesia with the patient or authorized representative who has indicated his/her understanding and acceptance.     Dental advisory given  Plan Discussed with: Anesthesiologist, CRNA and Surgeon  Anesthesia Plan Comments:        Anesthesia Quick Evaluation

## 2022-01-18 ENCOUNTER — Encounter (HOSPITAL_BASED_OUTPATIENT_CLINIC_OR_DEPARTMENT_OTHER): Payer: Self-pay | Admitting: Surgery

## 2022-01-18 DIAGNOSIS — C50412 Malignant neoplasm of upper-outer quadrant of left female breast: Secondary | ICD-10-CM | POA: Diagnosis not present

## 2022-01-18 NOTE — Discharge Summary (Signed)
Physician Discharge Summary  Patient ID: Kaitlyn Santana MRN: [id number] DOB/AGE: [birth year] 66 y.o.  Admit date: 01/17/2022 Discharge date: 01/18/2022  Admission Diagnoses:  Discharge Diagnoses:  Principal Problem:   Breast cancer Kaitlyn Santana)   Discharged Condition: good  [place] Course: Patient is a 66 year old female with history of left breast cancer.  Patient presented to [facility] surgery center yesterday for left breast total mastectomy with left axillary sentinel lymph node biopsy and right subclavian Port-A-Cath insertion by Dr. Ninfa Linden with immediate breast reconstruction with placement of Flex HD and tissue expanders by Dr. Marla Roe.  Patient was admitted for observation overnight.  Today, patient reports she is doing well. Daughter is present in the room with the patient. She denies any acute events overnight.  She states her pain has been controlled.  Patient reports she has been eating and drinking without issue.  Patient reports she has been voiding without issue and passing gas.  She denies any nausea or vomiting.  Patient reports that she is ready to go home today.  Consults: None  Significant Diagnostic Studies: N/A  Treatments: surgery: left breast total mastectomy with left axillary sentinel lymph node biopsy and right subclavian Port-A-Cath insertion by Dr. Ninfa Linden with immediate breast reconstruction with placement of Flex HD and tissue expanders by Dr. Marla Roe.   Discharge Exam: Blood pressure 132/79, pulse 83, temperature 98.5 F (36.9 C), resp. rate 18, height 5' 6.5" (1.689 m), weight 88.2 kg, SpO2 95 %. General appearance: alert, cooperative, and no distress Resp: Unlabored breathing, no respiratory distress Breasts: Expanders in place to the left breast.  The honeycomb dressing is clean dry and intact.  There is no evidence of hematoma or seroma.  There is no overlying erythema or ecchymosis to the left breast.  The JP drain is in place and functioning.   There is approximately 10 cc of serosanguineous fluid in the bulb.  Disposition: Discharge disposition: 01-Home or Self Care     All of the patient's and the patient's daughter's questions were answered.    Discharge Instructions     (HEART FAILURE PATIENTS) Call MD:  Anytime you have any of the following symptoms: 1) 3 pound weight gain in 24 hours or 5 pounds in 1 week 2) shortness of breath, with or without a dry hacking cough 3) swelling in the hands, feet or stomach 4) if you have to sleep on extra pillows at night in order to breathe.   Complete by: As directed    Call MD for:  difficulty breathing, headache or visual disturbances   Complete by: As directed    Call MD for:  extreme fatigue   Complete by: As directed    Call MD for:  hives   Complete by: As directed    Call MD for:  persistant dizziness or light-headedness   Complete by: As directed    Call MD for:  persistant nausea and vomiting   Complete by: As directed    Call MD for:  redness, tenderness, or signs of infection (pain, swelling, redness, odor or green/yellow discharge around incision site)   Complete by: As directed    Call MD for:  severe uncontrolled pain   Complete by: As directed    Call MD for:  temperature >100.4   Complete by: As directed    Diet - low sodium heart healthy   Complete by: As directed    Increase activity slowly   Complete by: As directed       Allergies  as of 01/18/2022       Reactions   Bupropion Itching   Ancef [cefazolin Sodium] Hives, Itching   Ciprofloxacin Hives, Itching        Medication List     TAKE these medications    CENTRUM SILVER 50+WOMEN PO Take 1 tablet by mouth daily.   diclofenac 75 MG EC tablet Commonly known as: VOLTAREN Take 75 mg by mouth daily.   metoprolol succinate 50 MG 24 hr tablet Commonly known as: TOPROL-XL Take 50 mg by mouth daily.   pantoprazole 40 MG tablet Commonly known as: PROTONIX Take 40 mg by mouth daily.    rOPINIRole 0.5 MG tablet Commonly known as: REQUIP Take 0.5 mg by mouth at bedtime.   rosuvastatin 10 MG tablet Commonly known as: CRESTOR Take 10 mg by mouth daily.   solifenacin 5 MG tablet Commonly known as: VESICARE Take 1 tablet (5 mg total) by mouth 2 (two) times daily.   sulfamethoxazole-trimethoprim 800-160 MG tablet Commonly known as: BACTRIM DS Take 1 tablet by mouth 2 (two) times daily.        Follow-up Information     Coralie Keens, MD. Schedule an appointment as soon as possible for a visit in 3 week(s).   Specialty: General Surgery Contact information: [address] [place] [address] [phone]         Wallace Going, DO Follow up in 10 day(s).   Specialty: Plastic Surgery Contact information: [address] [place] [address] [phone]                 [facility] [address] [place], [place] [address] [phone]  Signed: Clance Boll 01/18/2022, 7:31 AM

## 2022-01-18 NOTE — Anesthesia Postprocedure Evaluation (Signed)
Anesthesia Post Note  Patient: Kaitlyn Santana  Procedure(s) Performed: LEFT TOTAL MASTECTOMY WITH SENTINEL NODE BIOPSY (Left: Breast) INSERTION PORT-A-CATH (Right: Chest) IMMEDIATE LEFT BREAST RECONSTRUCTION WITH PLACEMENT OF TISSUE EXPANDER AND FLEX HD (ACELLULAR HYDRATED DERMIS) (Left: Breast)     Patient location during evaluation: PACU Anesthesia Type: General Level of consciousness: sedated Pain management: pain level controlled Vital Signs Assessment: post-procedure vital signs reviewed and stable Respiratory status: spontaneous breathing and respiratory function stable Cardiovascular status: stable Postop Assessment: no apparent nausea or vomiting Anesthetic complications: no   No notable events documented.                 Quamel Fitzmaurice DANIEL

## 2022-01-21 LAB — SURGICAL PATHOLOGY

## 2022-01-22 ENCOUNTER — Encounter: Payer: Self-pay | Admitting: *Deleted

## 2022-01-23 ENCOUNTER — Other Ambulatory Visit (HOSPITAL_COMMUNITY): Payer: PPO

## 2022-01-24 ENCOUNTER — Ambulatory Visit (INDEPENDENT_AMBULATORY_CARE_PROVIDER_SITE_OTHER): Payer: PPO | Admitting: Surgical

## 2022-01-24 ENCOUNTER — Encounter: Payer: Self-pay | Admitting: Surgical

## 2022-01-24 DIAGNOSIS — C50412 Malignant neoplasm of upper-outer quadrant of left female breast: Secondary | ICD-10-CM

## 2022-01-24 DIAGNOSIS — Z171 Estrogen receptor negative status [ER-]: Secondary | ICD-10-CM

## 2022-01-24 NOTE — Progress Notes (Signed)
Patient Care Team: Glenda Chroman, MD as PCP - General (Internal Medicine) Rockwell Germany, RN as Oncology Nurse Navigator Mauro Kaufmann, RN as Oncology Nurse Navigator Nicholas Lose, MD as Consulting Physician (Hematology and Oncology) Kyung Rudd, MD as Consulting Physician (Radiation Oncology) Coralie Keens, MD as Consulting Physician (General Surgery)  DIAGNOSIS:  Encounter Diagnosis  Name Primary?   Malignant neoplasm of upper-outer quadrant of left breast in female, estrogen receptor negative ([place])     SUMMARY OF ONCOLOGIC HISTORY: Oncology History  Malignant neoplasm of upper-outer quadrant of left breast in female, estrogen receptor negative ([place])  12/13/2021 Initial Diagnosis   Left breast biopsy UOQ: Grade 2 IDC with apocrine features, with high-grade DCIS, ER 0%, PR 0%, HER2 3+ positive, Ki-67 5%   01/17/2022 Surgery   Left mastectomy: Grade 2 apocrine DCIS without any residual invasive component, clear margins, 0/3 lymph nodes negative, ER 0%, PR 0%, HER2 3+ positive, Ki-67 5%     CHIEF COMPLIANT: Follow-up post op  INTERVAL HISTORY: Kaitlyn Santana is a 66 y.o. female is here because of recent diagnosis of left breast cancer. She presents to the clinic today for a follow-up. She was satisfied with no questions or concerns about her results. She states that she is a little sore but she has been taking tylenol and ibuprofen for relief.   ALLERGIES:  is allergic to bupropion, ancef [cefazolin sodium], and ciprofloxacin.  MEDICATIONS:  Current Outpatient Medications  Medication Sig Dispense Refill   diclofenac (VOLTAREN) 75 MG EC tablet Take 75 mg by mouth daily.     metoprolol (TOPROL-XL) 50 MG 24 hr tablet Take 50 mg by mouth daily.       Multiple Vitamins-Minerals (CENTRUM SILVER 50+WOMEN PO) Take 1 tablet by mouth daily.     pantoprazole (PROTONIX) 40 MG tablet Take 40 mg by mouth daily.      rOPINIRole (REQUIP) 0.5 MG tablet Take 0.5 mg by mouth at bedtime.      rosuvastatin (CRESTOR) 10 MG tablet Take 10 mg by mouth daily.     solifenacin (VESICARE) 5 MG tablet Take 1 tablet (5 mg total) by mouth 2 (two) times daily. 60 tablet 11   sulfamethoxazole-trimethoprim (BACTRIM DS) 800-160 MG tablet Take 1 tablet by mouth 2 (two) times daily. 28 tablet 0   No current facility-administered medications for this visit.    PHYSICAL EXAMINATION: ECOG PERFORMANCE STATUS: 1 - Symptomatic but completely ambulatory  Vitals:   01/25/22 0929  BP: 126/82  Pulse: 70  Temp: 97.9 F (36.6 C)  SpO2: 97%   Filed Weights   01/25/22 0929  Weight: 190 lb 11.2 oz (86.5 kg)     LABORATORY DATA:  I have reviewed the data as listed    Latest Ref Rng & Units 10/03/2020    1:20 PM 10/31/2016    6:43 AM 09/20/2010    9:25 AM  CMP  Glucose 70 - 99 mg/dL 97  116  82   BUN 8 - 23 mg/dL $Remove'16  17  15   'KxFbGor$ Creatinine 0.44 - 1.00 mg/dL 0.49  0.47  0.53   Sodium 135 - 145 mmol/L 136  139  139   Potassium 3.5 - 5.1 mmol/L 4.2  4.1  4.1 HEMOLYZED SPECIMEN, RESULTS MAY BE AFFECTED   Chloride 98 - 111 mmol/L 102  103  103   CO2 22 - 32 mmol/L $RemoveB'27  30  29   'sSvMgHFH$ Calcium 8.9 - 10.3 mg/dL 9.1  9.1  9.7  Lab Results  Component Value Date   WBC 9.2 10/03/2020   HGB 13.3 10/03/2020   HCT 41.8 10/03/2020   MCV 92.1 10/03/2020   PLT 266 10/03/2020   NEUTROABS 9.1 (H) 09/20/2010    ASSESSMENT & PLAN:  Malignant neoplasm of upper-outer quadrant of left breast in female, estrogen receptor negative ([place]) 01/17/2022:Left mastectomy: Grade 2 apocrine DCIS without any residual invasive component, clear margins, 0/3 lymph nodes negative, ER 0%, PR 0%, HER2 3+ positive, Ki-67 5%  Discussion: The invasive component based on the biopsy measured 0.4 cm  Treatment plan: 1.  No role of systemic chemotherapy based upon small invasive component 2. no role of adjuvant radiation since she had a mastectomy 3.  No role of adjuvant antiestrogen therapy since she is ER/PR negative.  I sent a  message to Dr. Ninfa Linden to remove the port.  Alternately she could get the port removed at the time of her plastic surgery for reconstruction.  Return to clinic in 3 months for survivorship care plan visit After that she could be seen once a year    No orders of the defined types were placed in this encounter.  The patient has a good understanding of the overall plan. she agrees with it. she will call with any problems that may develop before the next visit here. Total time spent: 30 mins including face to face time and time spent for planning, charting and co-ordination of care   Harriette Ohara, MD 01/25/22    I Gardiner Coins am scribing for Dr. Lindi Adie  I have reviewed the above documentation for accuracy and completeness, and I agree with the above.

## 2022-01-24 NOTE — Progress Notes (Signed)
Patient is a 66 year old female here for follow-up on her left breast reconstruction.  She underwent left total mastectomy with sentinel node biopsy by Dr. Ninfa Linden followed by immediate breast reconstruction placement of tissue expander and Flex HD with Dr. Marla Roe on 01/17/2022.  She presents today with her daughter and granddaughter.  She reports overall she is doing okay, has some pain that is well controlled with Tylenol ibuprofen.  She does reports she is having some phantom nerve pain associated with the loss of her nipple areola.  She denies any infectious symptoms.  Chaperone present on exam On exam honeycomb dressing and Steri-Strips in place, skin appears viable.  Incisions appear intact.  There is no erythema or cellulitic changes.  JP drain output has been approximately 20 to 50 cc per 24 hours over the past week.  There is serosanguineous drainage in the bulb today.  No subcutaneous fluid collections present.  Discussed with patient we will plan to leave drain in for 1 more week, we will plan to see her back in 1 week for reevaluation, removal of drain pending output and possible first expander fill.  All of her questions and her daughter's questions were answered to their content.  I do not see any signs of infection on exam.  Continue with compressive garments, avoid strenuous activities.

## 2022-01-25 ENCOUNTER — Other Ambulatory Visit: Payer: Self-pay | Admitting: Surgery

## 2022-01-25 ENCOUNTER — Other Ambulatory Visit: Payer: Self-pay

## 2022-01-25 ENCOUNTER — Encounter: Payer: Self-pay | Admitting: *Deleted

## 2022-01-25 ENCOUNTER — Inpatient Hospital Stay: Payer: PPO | Attending: Hematology and Oncology | Admitting: Hematology and Oncology

## 2022-01-25 DIAGNOSIS — Z9012 Acquired absence of left breast and nipple: Secondary | ICD-10-CM | POA: Diagnosis not present

## 2022-01-25 DIAGNOSIS — Z171 Estrogen receptor negative status [ER-]: Secondary | ICD-10-CM

## 2022-01-25 DIAGNOSIS — C50412 Malignant neoplasm of upper-outer quadrant of left female breast: Secondary | ICD-10-CM | POA: Insufficient documentation

## 2022-01-25 NOTE — Assessment & Plan Note (Signed)
01/17/2022:Left mastectomy: Grade 2 apocrine DCIS without any residual invasive component, clear margins, 0/3 lymph nodes negative, ER 0%, PR 0%, HER2 3+ positive, Ki-67 5%  Discussion: The invasive component based on the biopsy measured 0.4 cm  Treatment plan: 1.  No role of systemic chemotherapy based upon small invasive component 2. no role of adjuvant radiation since she had a mastectomy 3.  No role of adjuvant antiestrogen therapy since she is ER/PR negative.  Return to clinic in 3 months for survivorship care plan visit

## 2022-01-29 ENCOUNTER — Ambulatory Visit (INDEPENDENT_AMBULATORY_CARE_PROVIDER_SITE_OTHER): Payer: PPO | Admitting: Student

## 2022-01-29 DIAGNOSIS — Z171 Estrogen receptor negative status [ER-]: Secondary | ICD-10-CM

## 2022-01-29 DIAGNOSIS — C50412 Malignant neoplasm of upper-outer quadrant of left female breast: Secondary | ICD-10-CM

## 2022-01-29 NOTE — Progress Notes (Cosign Needed Addendum)
Patient is a 66 year old female with history of left breast cancer.  Patient underwent left total mastectomy with sentinel lymph node biopsy by Dr. Ninfa Linden followed by immediate breast reconstruction and with placement of tissue expander and Flex HD with Dr. Marla Roe and 01/17/2022.  Patient had a Mentor 535 cc expanders placed to the left breast.  100 cc of injectable saline were placed in the expander intraoperatively.  Patient presents to the clinic for postoperative follow-up.  Patient was last seen in the clinic on 01/24/2022.  At this visit, she overall reported she was doing okay.  She reported her pain was controlled with Tylenol and ibuprofen.  On exam, honeycomb dressing and Steri-Strips were in place.  Incisions appeared intact.  There is approximately 20 to 50 cc per 24-hour period coming from her JP drain over prior week.  Plan was for patient to follow-up in 1 week.  Patient also reports that she has been told by her oncologist that she no longer needs her port and that it can be removed.  Today, patient is accompanied by her daughter.  Patient reports she is doing well.  She states that she has some spasming type pain over her left breast.  Patient reports overall her pain is controlled.  Patient states that she has muscle relaxers at home.  Patient denies any other issues or complaints at this time.  She denies any fevers or chills.  Patient reports she has been putting out approximately 15 to 20 cc/day from her drain.  Chaperone present on exam.  On exam, patient is sitting upright in no acute distress.  Left breast expander is in place.  There is no overlying ecchymosis or erythema.  The honeycomb dressing was removed.  Incision is intact with Steri-Strips.  There are no significant fluid collections palpated on exam.  Left drain is in place and functioning.  There is approximately 10 cc of serosanguineous drainage in the bulb.  We placed injectable saline in the Expander using a sterile  technique: Left: 50 cc for a total of 150 cc/535 cc   I discussed with the patient to continue to wear compression and restrict her activities.  I discussed with the patient that we will plan to keep the drain in for a few more days.  I discussed with the patient that she can take a muscle relaxer if she feels like her muscle is spasming like she described.  Patient expressed understanding.  Patient to follow-up later this week or early next week for drain removal and evaluation.   Instructed patient to call if she has any questions or concerns.

## 2022-01-30 ENCOUNTER — Other Ambulatory Visit: Payer: Self-pay | Admitting: Surgery

## 2022-01-30 DIAGNOSIS — C50919 Malignant neoplasm of unspecified site of unspecified female breast: Secondary | ICD-10-CM | POA: Diagnosis not present

## 2022-01-30 DIAGNOSIS — F419 Anxiety disorder, unspecified: Secondary | ICD-10-CM | POA: Diagnosis not present

## 2022-01-30 DIAGNOSIS — I10 Essential (primary) hypertension: Secondary | ICD-10-CM | POA: Diagnosis not present

## 2022-01-30 DIAGNOSIS — Z09 Encounter for follow-up examination after completed treatment for conditions other than malignant neoplasm: Secondary | ICD-10-CM | POA: Diagnosis not present

## 2022-01-30 DIAGNOSIS — Z299 Encounter for prophylactic measures, unspecified: Secondary | ICD-10-CM | POA: Diagnosis not present

## 2022-01-30 DIAGNOSIS — Z713 Dietary counseling and surveillance: Secondary | ICD-10-CM | POA: Diagnosis not present

## 2022-01-31 ENCOUNTER — Ambulatory Visit: Payer: PPO | Admitting: Hematology and Oncology

## 2022-02-01 ENCOUNTER — Encounter: Payer: Self-pay | Admitting: Physician Assistant

## 2022-02-01 ENCOUNTER — Other Ambulatory Visit: Payer: Self-pay | Admitting: Surgery

## 2022-02-01 ENCOUNTER — Ambulatory Visit (INDEPENDENT_AMBULATORY_CARE_PROVIDER_SITE_OTHER): Payer: PPO | Admitting: Physician Assistant

## 2022-02-01 DIAGNOSIS — Z171 Estrogen receptor negative status [ER-]: Secondary | ICD-10-CM

## 2022-02-01 DIAGNOSIS — C50412 Malignant neoplasm of upper-outer quadrant of left female breast: Secondary | ICD-10-CM

## 2022-02-01 NOTE — Progress Notes (Signed)
This is a 66 year old female with a history of left breast cancer.  The patient underwent left total mastectomy with sentinel lymph node biopsy by Dr. Ninfa Linden followed by immediate breast reconstruction and placement of tissue expander with Flex HD with Dr. Marla Roe on 01/17/2022.  The patient had Mentor 535 cc expander placed to the left breast.  100 cc of injectable saline were placed into the expander intraoperatively.  The patient is following up today for postop evaluation.  The patient was most recently seen on 01/29/2022.  At that time she had been doing well, at that visit she did have 50 cc of injectable saline placed in the expander.  Left: 50 cc for a total of 150cc/535 cc  Her drain was left in place at that time.  At today's visit she denies any significant complaints or concerns, she denies any infectious symptoms.  Her drain has been putting out very minimal output over the last several days.  Chaperone present for exam.  On exam the patient sitting comfortably in no acute distress, the left breast flaps are viable with no overlying ecchymosis or erythema.  The incision is covered with Steri-Strips they are clean dry and intact.  No significant fluid collections noted to the breast.  Left drain with scant serous output in the bulb.  I did remove the drain today.  She tolerated this without difficulty.  The patient had no other further question concerns today, she will follow-up in our office next week, or sooner if she develops any new or worsening signs or symptoms.  The patient and her daughter who was present for exam verbalized understanding and agreement to today's plan had no further questions or concerns.

## 2022-02-04 NOTE — Progress Notes (Unsigned)
Patient is a 66 year old female with history of left breast cancer.  The patient underwent a left total mastectomy with sentinel lymph node biopsy by Dr. Ninfa Linden followed by immediate breast reconstruction and placement of tissue expander with Flex HD with Dr. Marla Roe in 01/17/2022.  The patient presents to the clinic for postoperative follow-up.  The patient was last seen in the clinic on 02/01/2022.  At this visit, patient had no new complaints or concerns.  The left drain was removed at this visit.  Plan was for patient to follow-up in the the following week.  Patient did not receive an expander fill at this visit.  Patient currently has 150 cc / 535 cc in her left expander.  Today,

## 2022-02-05 ENCOUNTER — Ambulatory Visit (INDEPENDENT_AMBULATORY_CARE_PROVIDER_SITE_OTHER): Payer: PPO | Admitting: Student

## 2022-02-05 DIAGNOSIS — Z171 Estrogen receptor negative status [ER-]: Secondary | ICD-10-CM

## 2022-02-05 DIAGNOSIS — C50412 Malignant neoplasm of upper-outer quadrant of left female breast: Secondary | ICD-10-CM

## 2022-02-07 ENCOUNTER — Encounter (HOSPITAL_BASED_OUTPATIENT_CLINIC_OR_DEPARTMENT_OTHER): Payer: Self-pay | Admitting: Surgery

## 2022-02-08 DIAGNOSIS — C50919 Malignant neoplasm of unspecified site of unspecified female breast: Secondary | ICD-10-CM

## 2022-02-08 HISTORY — DX: Malignant neoplasm of unspecified site of unspecified female breast: C50.919

## 2022-02-14 ENCOUNTER — Encounter: Payer: PPO | Admitting: Student

## 2022-02-15 ENCOUNTER — Ambulatory Visit (INDEPENDENT_AMBULATORY_CARE_PROVIDER_SITE_OTHER): Payer: PPO | Admitting: Student

## 2022-02-15 DIAGNOSIS — Z171 Estrogen receptor negative status [ER-]: Secondary | ICD-10-CM

## 2022-02-15 DIAGNOSIS — C50412 Malignant neoplasm of upper-outer quadrant of left female breast: Secondary | ICD-10-CM

## 2022-02-15 NOTE — Progress Notes (Signed)
Patient is a 66 year old female with history of left breast cancer.  The patient underwent a left total mastectomy with sentinel lymph node biopsy by Dr. Ninfa Linden followed by immediate breast reconstruction and placement of tissue expander with Flex HD with Dr. Marla Roe in 01/17/2022.  The patient presents to the clinic for postoperative follow-up.  Patient was last seen in the clinic on 02/05/2022.  At this visit, patient was doing well.  On exam, there is a small area near the medial aspect of the incision that had not completely healed.  Patient underwent expander fill at her last visit, 30 cc for a total of 180 cc / 535 cc.  Today, patient reports she is doing well.  She reports she sometimes has some burning pain to her left breast.  Patient denies any fevers or chills.  Patient denies any issues with the surgical site.  Chaperone present on exam.  On exam, patient is sitting upright in no acute distress.  The left breast expanders in place.  There is no overlying erythema or ecchymosis.  Incision is intact with Steri-Strips.  Steri-Strips were removed.  The small area near the medial aspect of the incision is improved from last time, but not fully healed.  Otherwise incision is intact.  New Steri-Strips were placed over the incision.  We placed injectable saline in the Expander using a sterile technique: Left: 50 cc for a total of 230 cc /535 cc   I discussed with the patient to continue compression at all times.  I discussed with the patient that she should avoid submerging her incision in water and to continue restrictions on physical activities.  Patient expressed understanding.  Patient to follow-up in a week and a half for another expander fill.  Pictures were obtained of the patient and placed in the chart with the patient's or guardian's permission.  I instructed the patient to call if she has any questions or concerns.

## 2022-02-23 DIAGNOSIS — R069 Unspecified abnormalities of breathing: Secondary | ICD-10-CM | POA: Diagnosis not present

## 2022-02-23 DIAGNOSIS — R531 Weakness: Secondary | ICD-10-CM | POA: Diagnosis not present

## 2022-02-23 DIAGNOSIS — R059 Cough, unspecified: Secondary | ICD-10-CM | POA: Diagnosis not present

## 2022-02-25 DIAGNOSIS — R5383 Other fatigue: Secondary | ICD-10-CM | POA: Diagnosis not present

## 2022-02-25 DIAGNOSIS — J209 Acute bronchitis, unspecified: Secondary | ICD-10-CM | POA: Diagnosis not present

## 2022-02-25 DIAGNOSIS — Z87891 Personal history of nicotine dependence: Secondary | ICD-10-CM | POA: Diagnosis not present

## 2022-02-25 DIAGNOSIS — Z299 Encounter for prophylactic measures, unspecified: Secondary | ICD-10-CM | POA: Diagnosis not present

## 2022-02-26 ENCOUNTER — Encounter: Payer: PPO | Admitting: Student

## 2022-03-01 ENCOUNTER — Ambulatory Visit (INDEPENDENT_AMBULATORY_CARE_PROVIDER_SITE_OTHER): Payer: PPO | Admitting: Physician Assistant

## 2022-03-01 ENCOUNTER — Encounter: Payer: Self-pay | Admitting: Physician Assistant

## 2022-03-01 DIAGNOSIS — Z171 Estrogen receptor negative status [ER-]: Secondary | ICD-10-CM

## 2022-03-01 DIAGNOSIS — C50412 Malignant neoplasm of upper-outer quadrant of left female breast: Secondary | ICD-10-CM

## 2022-03-01 NOTE — Progress Notes (Signed)
This is a 66 year old female with a history left-sided breast cancer status post left total mastectomy with sentinel lymph node biopsy by Dr. Ninfa Linden followed by immediate breast reconstruction and placement of tissue expander with Flex HD with Dr. Marla Roe on 01/17/2022.  The patient is seen in our office for follow-up evaluation.  The patient was last seen in the office on 02/15/2022, at that time she had been doing well.  She had expanders filled with 50 cc for a total of 230/535.  She tolerated this well, she denies any significant pain after the fill.  She does note that she occasionally has a sharp pain shooting into her breast, is not sustained.  She she denies any infectious symptoms today including fever chills, redness, discharge or warmth.  Chaperone present.  On exam the patient is sitting up in the exam chair no acute distress.  The left breast expanders in place.  There is no overlying redness to the breast, breast flaps are viable.  No palpable fluctuance or area of firmness, no warmth to touch.  Incision is clean dry and intact with routine healing.   Injectable saline was placed into bilateral tissue expanders     Left: 50 cc for a total of 280/535 cc  The patient tolerated this well without any issues or concerns.  Overall she is doing very well.  I would like to see her back in the office in 1 week for another fill, she may return sooner as needed if she develops any new or worsening signs or symptoms.  She verbalized standing and agreement to today's plan had no further questions or concerns.

## 2022-03-06 ENCOUNTER — Ambulatory Visit (INDEPENDENT_AMBULATORY_CARE_PROVIDER_SITE_OTHER): Payer: PPO | Admitting: Physician Assistant

## 2022-03-06 DIAGNOSIS — Z9889 Other specified postprocedural states: Secondary | ICD-10-CM

## 2022-03-06 NOTE — Progress Notes (Signed)
Patient is a 66 year old female s/p left-sided mastectomy with sentinel node biopsy and immediate reconstruction using tissue expander and Flex HD performed 01/17/2022 by Dr. Marla Roe who presents to clinic for postoperative follow-up.  She was last seen here in clinic on 03/01/2022.  At that time, she continued to endorse sharp shooting discomfort that was fleeting.  Exam was entirely reassuring.  Injectable saline was placed into the expander for a total of 280/535 cc.  Plan was for her to return in 1 week for additional expander fill.  Today, patient is doing well.  She states that ever since she transitioned out of her compressive sports bra to a normal bra, her sharp shooting discomfort has been completely alleviated.  She denies any redness, swelling, fevers, or other discomfort.  She feels largely recovered from her recent bronchitis for which she had been treated with antibiotics, steroids, and nebulizers.  Physical exam is entirely reassuring.  No obvious seroma or other subcutaneous fluid collections.  Incision well-healed.  No overlying skin changes.  We placed injectable saline in the Expander using a sterile technique: Left: 50 cc for a total of 330 / 535 cc  Patient tolerated injection without difficulty.  We will have her return in 1 week for additional fill.  Discussed nipple areolar restoration after implant exchange.

## 2022-03-12 NOTE — Progress Notes (Signed)
Patient is a very pleasant 66 year old female s/p left-sided mastectomy with sentinel node biopsy and immediate reconstruction using tissue expander and Flex HD performed 01/17/2022 by Dr. Marla Roe who presents to clinic for postoperative follow-up.  She was last seen here in clinic on 03/06/2022.  At that time, exam was reassuring.  Injectable saline was placed in the left tissue expander for a total of 330/535 cc.  Discussed nipple areolar restoration after implant exchange.  Today, she is doing well.  No complaints.  Feels prepared for next expander fill.  Denies any changes to her health since last encounter.  Physical exam is entirely reassuring.  No erythema or other overlying skin changes.  No subcutaneous fluid collections or obvious seroma noted.  Expander appropriately placed.  Skin is not too taut.  We placed injectable saline in the Expander using a sterile technique: Left: 70 cc for a total of 400 / 535 cc  Return in 7 to 10 days for repeat expander fill.  Plan for photos at next appointment.

## 2022-03-13 ENCOUNTER — Ambulatory Visit (INDEPENDENT_AMBULATORY_CARE_PROVIDER_SITE_OTHER): Payer: PPO | Admitting: Physician Assistant

## 2022-03-13 DIAGNOSIS — Z9889 Other specified postprocedural states: Secondary | ICD-10-CM

## 2022-03-21 NOTE — Progress Notes (Signed)
Patient is a very pleasant 66 year old female s/p left-sided mastectomy with sentinel node biopsy and immediate reconstruction using tissue expander and Flex HD performed 01/17/2022 by Dr. Marla Roe who presents to clinic for postoperative follow-up.  She was last seen here in clinic on 03/13/2022.  At that time, exam was entirely reassuring.  Injectable saline was placed in the expander using sterile technique for a total of 400/535 cc.  Plan was for return in 7 to 10 days for repeat expander fill.   Today, patient is doing well.  No complaints.  She is ready for her next expander fill.  She would still like to be much larger on the left side.  She is also interested in right-sided reduction and mastopexy for symmetry at time of implant exchange.  Physical exam is entirely reassuring.  No erythema or swelling noted to left breast.  Skin is still amenable to additional expander fills.  We placed injectable saline in the Expander using a sterile technique: Left: 70 cc for a total of 470 / 535 cc  Patient to return in 1 week for likely final expander fill on left side as well as obtain photos.  We will been plan for second phase of her reconstruction.  She will call the clinic should she have any questions or concerns in the interim.

## 2022-03-22 ENCOUNTER — Ambulatory Visit (INDEPENDENT_AMBULATORY_CARE_PROVIDER_SITE_OTHER): Payer: PPO | Admitting: Physician Assistant

## 2022-03-22 DIAGNOSIS — Z9889 Other specified postprocedural states: Secondary | ICD-10-CM

## 2022-03-28 NOTE — Progress Notes (Deleted)
Patient is a very pleasant 66 year old female s/p left-sided mastectomy with sentinel node biopsy and immediate reconstruction using tissue expander and Flex HD performed 01/17/2022 by Dr. Marla Roe who presents to clinic for postoperative follow-up.   She was last seen here in clinic on 03/22/2022.  At that time, 70 cc was placed into the left-sided expander for a total of 470/535 cc.  Plan is for patient to return in 1 week for likely final expander fill as well as obtain photos.  Plan for second phase of reconstruction.

## 2022-03-29 ENCOUNTER — Ambulatory Visit (INDEPENDENT_AMBULATORY_CARE_PROVIDER_SITE_OTHER): Payer: PPO | Admitting: Physician Assistant

## 2022-03-29 DIAGNOSIS — Z9889 Other specified postprocedural states: Secondary | ICD-10-CM

## 2022-03-29 NOTE — Progress Notes (Signed)
Patient is a very pleasant 66 year old female s/p left-sided mastectomy with sentinel node biopsy and immediate reconstruction using tissue expander and Flex HD performed 01/17/2022 by Dr. Marla Roe who presents to clinic for postoperative follow-up.   She was last seen here in clinic on 03/22/2022.  At that time, 70 cc was placed into the left-sided expander for a total of 470/535 cc.  Plan is for patient to return in 1 week for likely final expander fill as well as obtain photos.  Plan for second phase of reconstruction.  Since her last office visit she notes she has been doing well.  She denies any infectious signs or symptoms.  She notes that the expander does feel firm, she denies any wound issues.  She is eager to move on with the next phase of her reconstruction.  Chaperone present.  On exam the left breast expander in place.  No overlying redness to the breast, the flaps are viable, no palpable fluctuance or area of firmness, no warmth to touch.  The incisions are clean dry and intact with an exception of a stitch pushing through the incision.  We discussed a final fill today with 65 cc.   Injectable saline was placed into bilateral tissue expanders    Left: 65 cc for a total of 535/535 cc  The patient tolerated this well without any issues  Overall the patient is doing well, she has no complaints or issues.  She does feel that the size is where she would like it after this last fill.  At this time we will submit for exchange of expander for implant as well as right-sided mastopexy.

## 2022-04-05 ENCOUNTER — Other Ambulatory Visit: Payer: Self-pay

## 2022-04-05 ENCOUNTER — Encounter (HOSPITAL_BASED_OUTPATIENT_CLINIC_OR_DEPARTMENT_OTHER): Payer: Self-pay | Admitting: Plastic Surgery

## 2022-04-08 ENCOUNTER — Encounter: Payer: Self-pay | Admitting: Physician Assistant

## 2022-04-08 ENCOUNTER — Ambulatory Visit (INDEPENDENT_AMBULATORY_CARE_PROVIDER_SITE_OTHER): Payer: PPO | Admitting: Physician Assistant

## 2022-04-08 VITALS — BP 131/86 | HR 72 | Ht 66.5 in | Wt 189.8 lb

## 2022-04-08 DIAGNOSIS — C50412 Malignant neoplasm of upper-outer quadrant of left female breast: Secondary | ICD-10-CM

## 2022-04-08 DIAGNOSIS — Z171 Estrogen receptor negative status [ER-]: Secondary | ICD-10-CM

## 2022-04-08 MED ORDER — ONDANSETRON 4 MG PO TBDP: 4.0000 mg | ORAL_TABLET | Freq: Three times a day (TID) | ORAL | 0 refills | Status: DC | PRN

## 2022-04-08 MED ORDER — CLINDAMYCIN HCL 150 MG PO CAPS: 450.0000 mg | ORAL_CAPSULE | Freq: Three times a day (TID) | ORAL | 0 refills | Status: AC

## 2022-04-08 NOTE — H&P (View-Only) (Signed)
Patient ID: Kaitlyn Santana, female    DOB: 05/10/1956, 66 y.o.   MRN: [id number]  Chief Complaint  Patient presents with   Pre-op Exam      ICD-10-CM   1. Malignant neoplasm of upper-outer quadrant of left breast in female, estrogen receptor negative ([place])  C50.412    Z17.1        History of Present Illness: Kaitlyn Santana is a 66 y.o.  female  with a history of left-sided mastectomy s/p reconstruction using tissue expander Flex HD 01/17/2022.  She presents for preoperative evaluation for upcoming procedure, left-sided implant exchange with right-sided mastopexy for symmetry, scheduled for 04/10/2022 with Dr. Marla Roe.  The patient woke up after anesthesia particularly agitated following her left-sided mastectomy with reconstruction, but no other complications.  She will discuss with her anesthesiologist on day of surgery.  She denies any personal or family history of blood clots or clotting disorder.  Denies any personal history of severe cardiac or pulmonary disease.  She does not take any blood thinners or use any aspirin containing products.  She is a former smoker, no nicotine-containing products x15 years.  She is satisfied with current expander volume.  She tells me that she is also expecting to have her right-sided port removed at time of surgery despite this not being listed on her surgical sheet.  She states that Dr. Marla Roe is already aware and she will confirm this with her on day of surgery.  Summary of Previous Visit: She was last seen here in clinic on 03/29/2022.  She tolerated left side expander fill for a total of 535/535 cc in the expander.  She felt the size is where she would like it to be for her implant exchange.  Plan was for left-sided implant exchange along with a right-sided mastopexy for symmetry.  PMH Significant for: Left-sided breast cancer s/p tamoxifen and total mastectomy with immediate reconstruction 01/18/19/2023, HTN, IBS.   Past Medical  History: Allergies: Allergies  Allergen Reactions   Bupropion Itching   Ancef [Cefazolin Sodium] Hives and Itching   Ciprofloxacin Hives and Itching    Current Medications:  Current Outpatient Medications:    calcium carbonate (OS-CAL) 1250 (500 Ca) MG chewable tablet, Chew 1 tablet by mouth daily., Disp: , Rfl:    cyclobenzaprine (FLEXERIL) 10 MG tablet, Take 10 mg by mouth as needed., Disp: , Rfl:    diclofenac (VOLTAREN) 75 MG EC tablet, Take 75 mg by mouth daily., Disp: , Rfl:    magnesium 30 MG tablet, Take 30 mg by mouth 2 (two) times daily., Disp: , Rfl:    metoprolol (TOPROL-XL) 50 MG 24 hr tablet, Take 50 mg by mouth daily.  , Disp: , Rfl:    Multiple Vitamins-Minerals (CENTRUM SILVER 50+WOMEN PO), Take 1 tablet by mouth daily., Disp: , Rfl:    pantoprazole (PROTONIX) 40 MG tablet, Take 40 mg by mouth daily. , Disp: , Rfl:    rOPINIRole (REQUIP) 0.5 MG tablet, Take 0.5 mg by mouth at bedtime., Disp: , Rfl:    rosuvastatin (CRESTOR) 10 MG tablet, Take 10 mg by mouth daily., Disp: , Rfl:    solifenacin (VESICARE) 5 MG tablet, Take 1 tablet (5 mg total) by mouth 2 (two) times daily., Disp: 60 tablet, Rfl: 11   vitamin B-12 (CYANOCOBALAMIN) 100 MCG tablet, Take 100 mcg by mouth daily., Disp: , Rfl:   Past Medical Problems: Past Medical History:  Diagnosis Date   Arthritis    Breast cancer (  [place]) 02/2022   left breast IDC   Bronchitis    Cancer of left breast ([place]) 08/2003   GERD (gastroesophageal reflux disease)    HTN (hypertension)    Hyperlipidemia    Irritable bowel disease    Lumbar herniated disc    Other dyspnea and respiratory abnormality    Palpitations    Pre-diabetes    Rectal bleeding 08/10/2018    Past Surgical History: Past Surgical History:  Procedure Laterality Date   BREAST RECONSTRUCTION WITH PLACEMENT OF TISSUE EXPANDER AND FLEX HD (ACELLULAR HYDRATED DERMIS) Left 01/17/2022   Procedure: IMMEDIATE LEFT BREAST RECONSTRUCTION WITH PLACEMENT OF TISSUE  EXPANDER AND FLEX HD (ACELLULAR HYDRATED DERMIS);  Surgeon: Wallace Going, DO;  Location: [facility];  Service: Plastics;  Laterality: Left;   BREAST SURGERY     CANCER DIAG 08-2003   COLONOSCOPY WITH PROPOFOL N/A 11/29/2020   Procedure: COLONOSCOPY WITH PROPOFOL;  Surgeon: Rogene Houston, MD;  [facility];  Service: Endoscopy;  Laterality: N/A;  12:50   LUMBAR LAMINECTOMY/DECOMPRESSION MICRODISCECTOMY Right 10/31/2016   Procedure: Right Lumbar Five-Sacral One Lumbar laminotomy and microdiscectomy;  Surgeon: Jovita Gamma, MD;  Location: [place];  Service: Neurosurgery;  Laterality: Right;  Right   MASTECTOMY W/ SENTINEL NODE BIOPSY Left 01/17/2022   Procedure: LEFT TOTAL MASTECTOMY WITH SENTINEL NODE BIOPSY;  Surgeon: Coralie Keens, MD;  Location: [place];  Service: General;  Laterality: Left;  LMA   POLYPECTOMY  11/29/2020   Procedure: POLYPECTOMY;  Surgeon: Rogene Houston, MD;  [facility];  Service: Endoscopy;;   PORTACATH PLACEMENT Right 01/17/2022   Procedure: INSERTION PORT-A-CATH;  Surgeon: Coralie Keens, MD;  Location: [place];  Service: General;  Laterality: Right;   SHOULDER ARTHROSCOPY W/ LABRAL REPAIR Right    TOE SURGERY     VAGINAL HYSTERECTOMY      Social History: Social History   Socioeconomic History   Marital status: Married    Spouse name: Kaitlyn Santana   Number of children: 1   Years of education: Not on file   Highest education level: Not on file  Occupational History   Occupation: [employer]    Employer: [employer]  Tobacco Use   Smoking status: Former    Packs/day: 1.00    Years: 35.00    Total pack years: 35.00    Types: Cigarettes    Quit date: 05/10/2008    Years since quitting: 13.9   Smokeless tobacco: Never   Tobacco comments:    Quit in 2009  Vaping Use   Vaping Use: Never used  Substance and Sexual Activity   Alcohol use: Yes    Comment:  occasional   Drug use: No   Sexual activity: Yes    Birth control/protection: Surgical    Comment: hyst  Other Topics Concern   Not on file  Social History Narrative   Not on file   Social Determinants of Health   Financial Resource Strain: Medium Risk (12/25/2021)   Overall Financial Resource Strain (CARDIA)    Difficulty of Paying Living Expenses: Somewhat hard  Food Insecurity: No Food Insecurity (12/25/2021)   Hunger Vital Sign    Worried About Running Out of Food in the Last Year: Never true    Ran Out of Food in the Last Year: Never true  Transportation Needs: Not on file  Physical Activity: Not on file  Stress: Not on file  Social Connections: Not on file  Intimate Partner  Violence: Not on file    Family History: Family History  Problem Relation Age of Onset   Anxiety disorder Mother    Depression Brother    Stroke Maternal Grandfather    Breast cancer Paternal Grandmother     Review of Systems: ROS Denies any recent fevers, chest pain, difficulty breathing, leg swelling.  Physical Exam: Vital Signs BP 131/86 (BP Location: Right Arm, Patient Position: Sitting, Cuff Size: Large)   Pulse 72   Ht 5' 6.5" (1.689 m)   Wt 189 lb 12.8 oz (86.1 kg)   SpO2 98%   BMI 30.18 kg/m   Physical Exam Constitutional:      General: Not in acute distress.    Appearance: Normal appearance. Not ill-appearing.  HENT:     Head: Normocephalic and atraumatic.  Eyes:     Pupils: Pupils are equal, round. Cardiovascular:     Rate and Rhythm: Normal rate.    Pulses: Normal pulses.  Pulmonary:     Effort: No respiratory distress or increased work of breathing.  Speaks in full sentences. Abdominal:     General: Abdomen is flat. No distension.   Musculoskeletal: Normal range of motion. No lower extremity swelling or edema.  Scattered spider veins, but no varicosities. Skin:    General: Skin is warm and dry.     Findings: No erythema or rash.  Neurological:     Mental Status:  Alert and oriented to person, place, and time.  Psychiatric:        Mood and Affect: Mood normal.        Behavior: Behavior normal.    Assessment/Plan: The patient is scheduled for left-sided implant change and right-sided mastopexy for symmetry with Dr. Marla Roe.  Risks, benefits, and alternatives of procedure discussed, questions answered and consent obtained.    Smoking Status: Former smoker; Counseling Given?  None Last Mammogram: 11/29/2021, right breast without findings suspicious for malignancy.  Left-sided DCIS.  Caprini Score: 7; Risk Factors include: Age, BMI greater than 25, history of breast cancer, and length of planned surgery. Recommendation for mechanical and possibly pharmacological prophylaxis. Will discuss with surgeon and prescribe Lovenox for after surgery if indicated.  Will encourage early ambulation.   Pictures obtained: 03/29/2022  Post-op Rx sent to pharmacy: Zofran and Keflex. She reports that she still has all of her previously prescribed pain medication.   Patient was provided with the General Surgical Risk consent document and Pain Medication Agreement prior to their appointment.  They had adequate time to read through the risk consent documents and Pain Medication Agreement. We also discussed them in person together during this preop appointment. All of their questions were answered to their satisfaction.  Recommended calling if they have any further questions.  Risk consent form and Pain Medication Agreement to be scanned into patient's chart.  The risks that can be encountered with and after placement of a breast implant placement were discussed and include the following but not limited to these: bleeding, infection, delayed healing, anesthesia risks, skin sensation changes, injury to structures including nerves, blood vessels, and muscles which may be temporary or permanent, allergies to tape, suture materials and glues, blood products, topical preparations or  injected agents, skin contour irregularities, skin discoloration and swelling, deep vein thrombosis, cardiac and pulmonary complications, pain, which may persist, fluid accumulation, wrinkling of the skin over the implant, changes in nipple or breast sensation, implant leakage or rupture, faulty position of the implant, persistent pain, formation of tight scar tissue around the  implant (capsular contracture), possible need for revisional surgery or staged procedures.  The risk that can be encountered with mastopexy/breast lift were discussed and include the following but not limited to these:  Breast asymmetry, fluid accumulation, firmness of the breast, inability to breast feed, loss of nipple or areola, skin loss, decrease or no nipple sensation, fat necrosis of the breast tissue, bleeding, infection, healing delay.  There are risks of anesthesia, changes to skin sensation and injury to nerves or blood vessels.  The muscle can be temporarily or permanently injured.  You may have an allergic reaction to tape, suture, glue, blood products which can result in skin discoloration, swelling, pain, skin lesions, poor healing.  Any of these can lead to the need for revisonal surgery or stage procedures.  A mastopexy has potential to interfere with diagnostic procedures.  Nipple or breast piercing can increase risks of infection.  This procedure is best done when the breast is fully developed.  Changes in the breast will continue to occur over time.  Pregnancy can alter the outcomes of previous breast surgery, weight gain and weigh loss can also effect the long term appearance.     Electronically signed by: Krista Blue, PA-C 04/08/2022 9:44 AM

## 2022-04-08 NOTE — Progress Notes (Signed)
Patient ID: Kaitlyn Santana, female    DOB: Apr 03, 1956, 66 y.o.   MRN: [id number]  Chief Complaint  Patient presents with   Pre-op Exam      ICD-10-CM   1. Malignant neoplasm of upper-outer quadrant of left breast in female, estrogen receptor negative ([place])  C50.412    Z17.1        History of Present Illness: Kaitlyn Santana is a 66 y.o.  female  with a history of left-sided mastectomy s/p reconstruction using tissue expander Flex HD 01/17/2022.  She presents for preoperative evaluation for upcoming procedure, left-sided implant exchange with right-sided mastopexy for symmetry, scheduled for 04/10/2022 with Dr. Marla Roe.  The patient woke up after anesthesia particularly agitated following her left-sided mastectomy with reconstruction, but no other complications.  She will discuss with her anesthesiologist on day of surgery.  She denies any personal or family history of blood clots or clotting disorder.  Denies any personal history of severe cardiac or pulmonary disease.  She does not take any blood thinners or use any aspirin containing products.  She is a former smoker, no nicotine-containing products x15 years.  She is satisfied with current expander volume.  She tells me that she is also expecting to have her right-sided port removed at time of surgery despite this not being listed on her surgical sheet.  She states that Dr. Marla Roe is already aware and she will confirm this with her on day of surgery.  Summary of Previous Visit: She was last seen here in clinic on 03/29/2022.  She tolerated left side expander fill for a total of 535/535 cc in the expander.  She felt the size is where she would like it to be for her implant exchange.  Plan was for left-sided implant exchange along with a right-sided mastopexy for symmetry.  PMH Significant for: Left-sided breast cancer s/p tamoxifen and total mastectomy with immediate reconstruction 01/18/19/2023, HTN, IBS.   Past Medical  History: Allergies: Allergies  Allergen Reactions   Bupropion Itching   Ancef [Cefazolin Sodium] Hives and Itching   Ciprofloxacin Hives and Itching    Current Medications:  Current Outpatient Medications:    calcium carbonate (OS-CAL) 1250 (500 Ca) MG chewable tablet, Chew 1 tablet by mouth daily., Disp: , Rfl:    cyclobenzaprine (FLEXERIL) 10 MG tablet, Take 10 mg by mouth as needed., Disp: , Rfl:    diclofenac (VOLTAREN) 75 MG EC tablet, Take 75 mg by mouth daily., Disp: , Rfl:    magnesium 30 MG tablet, Take 30 mg by mouth 2 (two) times daily., Disp: , Rfl:    metoprolol (TOPROL-XL) 50 MG 24 hr tablet, Take 50 mg by mouth daily.  , Disp: , Rfl:    Multiple Vitamins-Minerals (CENTRUM SILVER 50+WOMEN PO), Take 1 tablet by mouth daily., Disp: , Rfl:    pantoprazole (PROTONIX) 40 MG tablet, Take 40 mg by mouth daily. , Disp: , Rfl:    rOPINIRole (REQUIP) 0.5 MG tablet, Take 0.5 mg by mouth at bedtime., Disp: , Rfl:    rosuvastatin (CRESTOR) 10 MG tablet, Take 10 mg by mouth daily., Disp: , Rfl:    solifenacin (VESICARE) 5 MG tablet, Take 1 tablet (5 mg total) by mouth 2 (two) times daily., Disp: 60 tablet, Rfl: 11   vitamin B-12 (CYANOCOBALAMIN) 100 MCG tablet, Take 100 mcg by mouth daily., Disp: , Rfl:   Past Medical Problems: Past Medical History:  Diagnosis Date   Arthritis    Breast cancer (  [place]) 02/2022   left breast IDC   Bronchitis    Cancer of left breast ([place]) 08/2003   GERD (gastroesophageal reflux disease)    HTN (hypertension)    Hyperlipidemia    Irritable bowel disease    Lumbar herniated disc    Other dyspnea and respiratory abnormality    Palpitations    Pre-diabetes    Rectal bleeding 08/10/2018    Past Surgical History: Past Surgical History:  Procedure Laterality Date   BREAST RECONSTRUCTION WITH PLACEMENT OF TISSUE EXPANDER AND FLEX HD (ACELLULAR HYDRATED DERMIS) Left 01/17/2022   Procedure: IMMEDIATE LEFT BREAST RECONSTRUCTION WITH PLACEMENT OF TISSUE  EXPANDER AND FLEX HD (ACELLULAR HYDRATED DERMIS);  Surgeon: Wallace Going, DO;  Location: [place];  Service: Plastics;  Laterality: Left;   BREAST SURGERY     CANCER DIAG 08-2003   COLONOSCOPY WITH PROPOFOL N/A 11/29/2020   Procedure: COLONOSCOPY WITH PROPOFOL;  Surgeon: Rogene Houston, MD;  [facility];  Service: Endoscopy;  Laterality: N/A;  12:50   LUMBAR LAMINECTOMY/DECOMPRESSION MICRODISCECTOMY Right 10/31/2016   Procedure: Right Lumbar Five-Sacral One Lumbar laminotomy and microdiscectomy;  Surgeon: Jovita Gamma, MD;  Location: [place];  Service: Neurosurgery;  Laterality: Right;  Right   MASTECTOMY W/ SENTINEL NODE BIOPSY Left 01/17/2022   Procedure: LEFT TOTAL MASTECTOMY WITH SENTINEL NODE BIOPSY;  Surgeon: Coralie Keens, MD;  Location: [place];  Service: General;  Laterality: Left;  LMA   POLYPECTOMY  11/29/2020   Procedure: POLYPECTOMY;  Surgeon: Rogene Houston, MD;  [facility];  Service: Endoscopy;;   PORTACATH PLACEMENT Right 01/17/2022   Procedure: INSERTION PORT-A-CATH;  Surgeon: Coralie Keens, MD;  Location: [place];  Service: General;  Laterality: Right;   SHOULDER ARTHROSCOPY W/ LABRAL REPAIR Right    TOE SURGERY     VAGINAL HYSTERECTOMY      Social History: Social History   Socioeconomic History   Marital status: Married    Spouse name: RONALD   Number of children: 1   Years of education: Not on file   Highest education level: Not on file  Occupational History   Occupation: [employer]    Employer: [employer]  Tobacco Use   Smoking status: Former    Packs/day: 1.00    Years: 35.00    Total pack years: 35.00    Types: Cigarettes    Quit date: 05/10/2008    Years since quitting: 13.9   Smokeless tobacco: Never   Tobacco comments:    Quit in 2009  Vaping Use   Vaping Use: Never used  Substance and Sexual Activity   Alcohol use: Yes    Comment:  occasional   Drug use: No   Sexual activity: Yes    Birth control/protection: Surgical    Comment: hyst  Other Topics Concern   Not on file  Social History Narrative   Not on file   Social Determinants of Health   Financial Resource Strain: Medium Risk (12/25/2021)   Overall Financial Resource Strain (CARDIA)    Difficulty of Paying Living Expenses: Somewhat hard  Food Insecurity: No Food Insecurity (12/25/2021)   Hunger Vital Sign    Worried About Running Out of Food in the Last Year: Never true    Ran Out of Food in the Last Year: Never true  Transportation Needs: Not on file  Physical Activity: Not on file  Stress: Not on file  Social Connections: Not on file  Intimate Partner  Violence: Not on file    Family History: Family History  Problem Relation Age of Onset   Anxiety disorder Mother    Depression Brother    Stroke Maternal Grandfather    Breast cancer Paternal Grandmother     Review of Systems: ROS Denies any recent fevers, chest pain, difficulty breathing, leg swelling.  Physical Exam: Vital Signs BP 131/86 (BP Location: Right Arm, Patient Position: Sitting, Cuff Size: Large)   Pulse 72   Ht 5' 6.5" (1.689 m)   Wt 189 lb 12.8 oz (86.1 kg)   SpO2 98%   BMI 30.18 kg/m   Physical Exam Constitutional:      General: Not in acute distress.    Appearance: Normal appearance. Not ill-appearing.  HENT:     Head: Normocephalic and atraumatic.  Eyes:     Pupils: Pupils are equal, round. Cardiovascular:     Rate and Rhythm: Normal rate.    Pulses: Normal pulses.  Pulmonary:     Effort: No respiratory distress or increased work of breathing.  Speaks in full sentences. Abdominal:     General: Abdomen is flat. No distension.   Musculoskeletal: Normal range of motion. No lower extremity swelling or edema.  Scattered spider veins, but no varicosities. Skin:    General: Skin is warm and dry.     Findings: No erythema or rash.  Neurological:     Mental Status:  Alert and oriented to person, place, and time.  Psychiatric:        Mood and Affect: Mood normal.        Behavior: Behavior normal.    Assessment/Plan: The patient is scheduled for left-sided implant change and right-sided mastopexy for symmetry with Dr. Marla Roe.  Risks, benefits, and alternatives of procedure discussed, questions answered and consent obtained.    Smoking Status: Former smoker; Counseling Given?  None Last Mammogram: 11/29/2021, right breast without findings suspicious for malignancy.  Left-sided DCIS.  Caprini Score: 7; Risk Factors include: Age, BMI greater than 25, history of breast cancer, and length of planned surgery. Recommendation for mechanical and possibly pharmacological prophylaxis. Will discuss with surgeon and prescribe Lovenox for after surgery if indicated.  Will encourage early ambulation.   Pictures obtained: 03/29/2022  Post-op Rx sent to pharmacy: Zofran and Keflex. She reports that she still has all of her previously prescribed pain medication.   Patient was provided with the General Surgical Risk consent document and Pain Medication Agreement prior to their appointment.  They had adequate time to read through the risk consent documents and Pain Medication Agreement. We also discussed them in person together during this preop appointment. All of their questions were answered to their satisfaction.  Recommended calling if they have any further questions.  Risk consent form and Pain Medication Agreement to be scanned into patient's chart.  The risks that can be encountered with and after placement of a breast implant placement were discussed and include the following but not limited to these: bleeding, infection, delayed healing, anesthesia risks, skin sensation changes, injury to structures including nerves, blood vessels, and muscles which may be temporary or permanent, allergies to tape, suture materials and glues, blood products, topical preparations or  injected agents, skin contour irregularities, skin discoloration and swelling, deep vein thrombosis, cardiac and pulmonary complications, pain, which may persist, fluid accumulation, wrinkling of the skin over the implant, changes in nipple or breast sensation, implant leakage or rupture, faulty position of the implant, persistent pain, formation of tight scar tissue around the  implant (capsular contracture), possible need for revisional surgery or staged procedures.  The risk that can be encountered with mastopexy/breast lift were discussed and include the following but not limited to these:  Breast asymmetry, fluid accumulation, firmness of the breast, inability to breast feed, loss of nipple or areola, skin loss, decrease or no nipple sensation, fat necrosis of the breast tissue, bleeding, infection, healing delay.  There are risks of anesthesia, changes to skin sensation and injury to nerves or blood vessels.  The muscle can be temporarily or permanently injured.  You may have an allergic reaction to tape, suture, glue, blood products which can result in skin discoloration, swelling, pain, skin lesions, poor healing.  Any of these can lead to the need for revisonal surgery or stage procedures.  A mastopexy has potential to interfere with diagnostic procedures.  Nipple or breast piercing can increase risks of infection.  This procedure is best done when the breast is fully developed.  Changes in the breast will continue to occur over time.  Pregnancy can alter the outcomes of previous breast surgery, weight gain and weigh loss can also effect the long term appearance.     Electronically signed by: Krista Blue, PA-C 04/08/2022 9:44 AM

## 2022-04-09 NOTE — Anesthesia Preprocedure Evaluation (Signed)
Anesthesia Evaluation  Patient identified by MRN, date of birth, ID band Patient awake    Reviewed: Allergy & Precautions, NPO status , Patient's Chart, lab work & pertinent test results  History of Anesthesia Complications Negative for: history of anesthetic complications  Airway Mallampati: III  TM Distance: >3 FB Neck ROM: Full    Dental no notable dental hx. (+) Dental Advisory Given   Pulmonary neg pulmonary ROS, former smoker,    Pulmonary exam normal        Cardiovascular hypertension, Pt. on home beta blockers Normal cardiovascular exam     Neuro/Psych PSYCHIATRIC DISORDERS Anxiety Depression negative neurological ROS     GI/Hepatic Neg liver ROS, GERD  Medicated,IBS   Endo/Other  negative endocrine ROS  Renal/GU negative Renal ROS     Musculoskeletal  (+) Arthritis , Osteoarthritis,    Abdominal   Peds  Hematology negative hematology ROS (+)   Anesthesia Other Findings   Reproductive/Obstetrics                             Anesthesia Physical  Anesthesia Plan  ASA: 3  Anesthesia Plan: General   Post-op Pain Management: Celebrex PO (pre-op)* and Tylenol PO (pre-op)*   Induction: Intravenous  PONV Risk Score and Plan: Ondansetron, Dexamethasone and TIVA  Airway Management Planned: LMA  Additional Equipment: None  Intra-op Plan:   Post-operative Plan: Extubation in OR  Informed Consent: I have reviewed the patients History and Physical, chart, labs and discussed the procedure including the risks, benefits and alternatives for the proposed anesthesia with the patient or authorized representative who has indicated his/her understanding and acceptance.     Dental advisory given  Plan Discussed with: Anesthesiologist, CRNA and Surgeon  Anesthesia Plan Comments:         Anesthesia Quick Evaluation

## 2022-04-10 ENCOUNTER — Encounter (HOSPITAL_BASED_OUTPATIENT_CLINIC_OR_DEPARTMENT_OTHER): Payer: Self-pay | Admitting: Plastic Surgery

## 2022-04-10 ENCOUNTER — Other Ambulatory Visit: Payer: Self-pay

## 2022-04-10 ENCOUNTER — Ambulatory Visit (HOSPITAL_BASED_OUTPATIENT_CLINIC_OR_DEPARTMENT_OTHER): Payer: PPO | Admitting: Anesthesiology

## 2022-04-10 ENCOUNTER — Encounter (HOSPITAL_BASED_OUTPATIENT_CLINIC_OR_DEPARTMENT_OTHER)
Admission: RE | Disposition: A | Discharge status: Home / Self Care | Payer: Self-pay | Source: Home / Self Care | Attending: Plastic Surgery

## 2022-04-10 ENCOUNTER — Ambulatory Visit (HOSPITAL_BASED_OUTPATIENT_CLINIC_OR_DEPARTMENT_OTHER)
Admission: RE | Admit: 2022-04-10 | Discharge: 2022-04-10 | Disposition: A | Discharge status: Home / Self Care | Payer: PPO | Attending: Plastic Surgery | Admitting: Plastic Surgery

## 2022-04-10 DIAGNOSIS — N6489 Other specified disorders of breast: Secondary | ICD-10-CM | POA: Diagnosis not present

## 2022-04-10 DIAGNOSIS — Z87891 Personal history of nicotine dependence: Secondary | ICD-10-CM | POA: Insufficient documentation

## 2022-04-10 DIAGNOSIS — N651 Disproportion of reconstructed breast: Secondary | ICD-10-CM | POA: Diagnosis not present

## 2022-04-10 DIAGNOSIS — M1990 Unspecified osteoarthritis, unspecified site: Secondary | ICD-10-CM | POA: Diagnosis not present

## 2022-04-10 DIAGNOSIS — N6021 Fibroadenosis of right breast: Secondary | ICD-10-CM | POA: Diagnosis not present

## 2022-04-10 DIAGNOSIS — Z01818 Encounter for other preprocedural examination: Secondary | ICD-10-CM

## 2022-04-10 DIAGNOSIS — N6081 Other benign mammary dysplasias of right breast: Secondary | ICD-10-CM | POA: Diagnosis not present

## 2022-04-10 DIAGNOSIS — I10 Essential (primary) hypertension: Secondary | ICD-10-CM | POA: Diagnosis not present

## 2022-04-10 DIAGNOSIS — Z421 Encounter for breast reconstruction following mastectomy: Secondary | ICD-10-CM | POA: Insufficient documentation

## 2022-04-10 DIAGNOSIS — Z9012 Acquired absence of left breast and nipple: Secondary | ICD-10-CM | POA: Insufficient documentation

## 2022-04-10 DIAGNOSIS — K219 Gastro-esophageal reflux disease without esophagitis: Secondary | ICD-10-CM | POA: Insufficient documentation

## 2022-04-10 DIAGNOSIS — N6011 Diffuse cystic mastopathy of right breast: Secondary | ICD-10-CM | POA: Diagnosis not present

## 2022-04-10 DIAGNOSIS — F418 Other specified anxiety disorders: Secondary | ICD-10-CM | POA: Diagnosis not present

## 2022-04-10 DIAGNOSIS — Z79899 Other long term (current) drug therapy: Secondary | ICD-10-CM | POA: Diagnosis not present

## 2022-04-10 DIAGNOSIS — Z853 Personal history of malignant neoplasm of breast: Secondary | ICD-10-CM | POA: Insufficient documentation

## 2022-04-10 DIAGNOSIS — N62 Hypertrophy of breast: Secondary | ICD-10-CM | POA: Diagnosis not present

## 2022-04-10 DIAGNOSIS — Z452 Encounter for adjustment and management of vascular access device: Secondary | ICD-10-CM | POA: Insufficient documentation

## 2022-04-10 HISTORY — PX: BREAST RECONSTRUCTION: SHX9

## 2022-04-10 HISTORY — PX: PORT-A-CATH REMOVAL: SHX5289

## 2022-04-10 HISTORY — PX: REMOVAL OF TISSUE EXPANDER AND PLACEMENT OF IMPLANT: SHX6457

## 2022-04-10 HISTORY — DX: Prediabetes: R73.03

## 2022-04-10 SURGERY — REMOVAL, TISSUE EXPANDER, BREAST, WITH IMPLANT INSERTION
Anesthesia: General | Site: Chest | Laterality: Right

## 2022-04-10 MED ORDER — ONDANSETRON HCL 4 MG/2ML IJ SOLN
INTRAMUSCULAR | Status: AC
  Filled 2022-04-10: qty 2

## 2022-04-10 MED ORDER — ACETAMINOPHEN 325 MG PO TABS: 650.0000 mg | ORAL_TABLET | ORAL | Status: DC | PRN

## 2022-04-10 MED ORDER — SODIUM CHLORIDE 0.9% FLUSH: 3.0000 mL | INTRAVENOUS | Status: DC | PRN

## 2022-04-10 MED ORDER — PROPOFOL 10 MG/ML IV BOLUS
INTRAVENOUS | Status: DC | PRN
  Administered 2022-04-10: 150 mg via INTRAVENOUS

## 2022-04-10 MED ORDER — DEXAMETHASONE SODIUM PHOSPHATE 10 MG/ML IJ SOLN
INTRAMUSCULAR | Status: AC
  Filled 2022-04-10: qty 1

## 2022-04-10 MED ORDER — SODIUM CHLORIDE 0.9% FLUSH: 3.0000 mL | Freq: Two times a day (BID) | INTRAVENOUS | Status: DC

## 2022-04-10 MED ORDER — PHENYLEPHRINE HCL (PRESSORS) 10 MG/ML IV SOLN
INTRAVENOUS | Status: DC | PRN
  Administered 2022-04-10: 80 ug via INTRAVENOUS

## 2022-04-10 MED ORDER — LACTATED RINGERS IV SOLN: INTRAVENOUS | Status: DC

## 2022-04-10 MED ORDER — ACETAMINOPHEN 500 MG PO TABS: 1000.0000 mg | ORAL_TABLET | Freq: Once | ORAL | Status: DC

## 2022-04-10 MED ORDER — MIDAZOLAM HCL 2 MG/2ML IJ SOLN
INTRAMUSCULAR | Status: AC
  Filled 2022-04-10: qty 2

## 2022-04-10 MED ORDER — ACETAMINOPHEN 500 MG PO TABS
ORAL_TABLET | ORAL | Status: AC
  Filled 2022-04-10: qty 2

## 2022-04-10 MED ORDER — SUCCINYLCHOLINE CHLORIDE 200 MG/10ML IV SOSY
PREFILLED_SYRINGE | INTRAVENOUS | Status: AC
  Filled 2022-04-10: qty 10

## 2022-04-10 MED ORDER — ACETAMINOPHEN 160 MG/5ML PO SOLN: 325.0000 mg | ORAL | Status: DC | PRN

## 2022-04-10 MED ORDER — DROPERIDOL 2.5 MG/ML IJ SOLN
INTRAMUSCULAR | Status: DC | PRN
  Administered 2022-04-10: .625 mg via INTRAVENOUS

## 2022-04-10 MED ORDER — PHENYLEPHRINE HCL (PRESSORS) 10 MG/ML IV SOLN
INTRAVENOUS | Status: AC
  Filled 2022-04-10: qty 1

## 2022-04-10 MED ORDER — ACETAMINOPHEN 325 MG RE SUPP: 650.0000 mg | RECTAL | Status: DC | PRN

## 2022-04-10 MED ORDER — DEXMEDETOMIDINE HCL IN NACL 80 MCG/20ML IV SOLN
INTRAVENOUS | Status: DC | PRN
  Administered 2022-04-10: 12 ug via BUCCAL

## 2022-04-10 MED ORDER — GENTAMICIN SULFATE 40 MG/ML IJ SOLN
5.0000 mg/kg | INTRAVENOUS | Status: DC
  Filled 2022-04-10: qty 10.75

## 2022-04-10 MED ORDER — FENTANYL CITRATE (PF) 100 MCG/2ML IJ SOLN
INTRAMUSCULAR | Status: AC
  Filled 2022-04-10: qty 2

## 2022-04-10 MED ORDER — FENTANYL CITRATE (PF) 100 MCG/2ML IJ SOLN: 25.0000 ug | INTRAMUSCULAR | Status: DC | PRN

## 2022-04-10 MED ORDER — ONDANSETRON HCL 4 MG/2ML IJ SOLN
INTRAMUSCULAR | Status: DC | PRN
  Administered 2022-04-10: 4 mg via INTRAVENOUS

## 2022-04-10 MED ORDER — FENTANYL CITRATE (PF) 100 MCG/2ML IJ SOLN
INTRAMUSCULAR | Status: DC | PRN
  Administered 2022-04-10: 100 ug via INTRAVENOUS
  Administered 2022-04-10: 50 ug via INTRAVENOUS

## 2022-04-10 MED ORDER — EPHEDRINE 5 MG/ML INJ
INTRAVENOUS | Status: AC
  Filled 2022-04-10: qty 5

## 2022-04-10 MED ORDER — ACETAMINOPHEN 500 MG PO TABS
1000.0000 mg | ORAL_TABLET | Freq: Once | ORAL | Status: AC
  Administered 2022-04-10: 1000 mg via ORAL

## 2022-04-10 MED ORDER — LIDOCAINE HCL (CARDIAC) PF 100 MG/5ML IV SOSY
PREFILLED_SYRINGE | INTRAVENOUS | Status: DC | PRN
  Administered 2022-04-10: 40 mg via INTRAVENOUS

## 2022-04-10 MED ORDER — BUPIVACAINE-EPINEPHRINE 0.25% -1:200000 IJ SOLN
INTRAMUSCULAR | Status: DC | PRN
  Administered 2022-04-10: 35 mL

## 2022-04-10 MED ORDER — VANCOMYCIN HCL 1000 MG IV SOLR
INTRAVENOUS | Status: DC
  Filled 2022-04-10: qty 20

## 2022-04-10 MED ORDER — OXYCODONE HCL 5 MG/5ML PO SOLN: 5.0000 mg | Freq: Once | ORAL | Status: DC | PRN

## 2022-04-10 MED ORDER — PHENYLEPHRINE HCL-NACL 20-0.9 MG/250ML-% IV SOLN
INTRAVENOUS | Status: DC | PRN
  Administered 2022-04-10: 25 ug/min via INTRAVENOUS

## 2022-04-10 MED ORDER — VANCOMYCIN HCL IN DEXTROSE 1-5 GM/200ML-% IV SOLN
INTRAVENOUS | Status: AC
  Filled 2022-04-10: qty 200

## 2022-04-10 MED ORDER — GENTAMICIN SULFATE 40 MG/ML IJ SOLN
5.0000 mg/kg | INTRAVENOUS | Status: DC
  Filled 2022-04-10: qty 8.75

## 2022-04-10 MED ORDER — SODIUM CHLORIDE 0.9 % IV SOLN: 250.0000 mL | INTRAVENOUS | Status: DC | PRN

## 2022-04-10 MED ORDER — CELECOXIB 200 MG PO CAPS
200.0000 mg | ORAL_CAPSULE | Freq: Once | ORAL | Status: AC
  Administered 2022-04-10: 200 mg via ORAL

## 2022-04-10 MED ORDER — LIDOCAINE 2% (20 MG/ML) 5 ML SYRINGE
INTRAMUSCULAR | Status: AC
  Filled 2022-04-10: qty 5

## 2022-04-10 MED ORDER — EPHEDRINE SULFATE (PRESSORS) 50 MG/ML IJ SOLN
INTRAMUSCULAR | Status: DC | PRN
  Administered 2022-04-10: 10 mg via INTRAVENOUS

## 2022-04-10 MED ORDER — PHENYLEPHRINE 80 MCG/ML (10ML) SYRINGE FOR IV PUSH (FOR BLOOD PRESSURE SUPPORT)
PREFILLED_SYRINGE | INTRAVENOUS | Status: AC
  Filled 2022-04-10: qty 10

## 2022-04-10 MED ORDER — DEXMEDETOMIDINE HCL IN NACL 80 MCG/20ML IV SOLN
INTRAVENOUS | Status: AC
  Filled 2022-04-10: qty 20

## 2022-04-10 MED ORDER — VANCOMYCIN HCL IN DEXTROSE 1-5 GM/200ML-% IV SOLN
1000.0000 mg | Freq: Once | INTRAVENOUS | Status: AC
  Administered 2022-04-10: 1000 mg via INTRAVENOUS

## 2022-04-10 MED ORDER — MIDAZOLAM HCL 5 MG/5ML IJ SOLN
INTRAMUSCULAR | Status: DC | PRN
  Administered 2022-04-10: 2 mg via INTRAVENOUS

## 2022-04-10 MED ORDER — ONDANSETRON HCL 4 MG/2ML IJ SOLN: 4.0000 mg | Freq: Once | INTRAMUSCULAR | Status: DC | PRN

## 2022-04-10 MED ORDER — CHLORHEXIDINE GLUCONATE CLOTH 2 % EX PADS: 6.0000 | MEDICATED_PAD | Freq: Once | CUTANEOUS | Status: DC

## 2022-04-10 MED ORDER — OXYCODONE HCL 5 MG PO TABS: 5.0000 mg | ORAL_TABLET | Freq: Once | ORAL | Status: DC | PRN

## 2022-04-10 MED ORDER — DEXAMETHASONE SODIUM PHOSPHATE 4 MG/ML IJ SOLN
INTRAMUSCULAR | Status: DC | PRN
  Administered 2022-04-10: 5 mg via INTRAVENOUS

## 2022-04-10 MED ORDER — OXYCODONE HCL 5 MG PO TABS: 5.0000 mg | ORAL_TABLET | ORAL | Status: DC | PRN

## 2022-04-10 MED ORDER — CELECOXIB 200 MG PO CAPS
ORAL_CAPSULE | ORAL | Status: AC
  Filled 2022-04-10: qty 1

## 2022-04-10 MED ORDER — ACETAMINOPHEN 325 MG PO TABS: 325.0000 mg | ORAL_TABLET | ORAL | Status: DC | PRN

## 2022-04-10 MED ORDER — MEPERIDINE HCL 25 MG/ML IJ SOLN: 6.2500 mg | INTRAMUSCULAR | Status: DC | PRN

## 2022-04-10 SURGICAL SUPPLY — 73 items
ADH SKN CLS APL DERMABOND .7 (GAUZE/BANDAGES/DRESSINGS)
BAG DECANTER FOR FLEXI CONT (MISCELLANEOUS) ×3 IMPLANT
BINDER BREAST LRG (GAUZE/BANDAGES/DRESSINGS) IMPLANT
BINDER BREAST MEDIUM (GAUZE/BANDAGES/DRESSINGS) IMPLANT
BINDER BREAST XLRG (GAUZE/BANDAGES/DRESSINGS) IMPLANT
BINDER BREAST XXLRG (GAUZE/BANDAGES/DRESSINGS) IMPLANT
BIOPATCH RED 1 DISK 7.0 (GAUZE/BANDAGES/DRESSINGS) IMPLANT
BLADE HEX COATED 2.75 (ELECTRODE) ×3 IMPLANT
BLADE SURG 15 STRL LF DISP TIS (BLADE) ×3 IMPLANT
BLADE SURG 15 STRL SS (BLADE) ×3
BNDG GAUZE DERMACEA FLUFF 4 (GAUZE/BANDAGES/DRESSINGS) ×6 IMPLANT
BNDG GZE DERMACEA 4 6PLY (GAUZE/BANDAGES/DRESSINGS) ×6
CANISTER SUCT 1200ML W/VALVE (MISCELLANEOUS) ×3 IMPLANT
COVER BACK TABLE 60X90IN (DRAPES) ×3 IMPLANT
COVER MAYO STAND STRL (DRAPES) ×3 IMPLANT
DERMABOND ADVANCED .7 DNX12 (GAUZE/BANDAGES/DRESSINGS) IMPLANT
DRAIN CHANNEL 19F RND (DRAIN) IMPLANT
DRAPE LAPAROSCOPIC ABDOMINAL (DRAPES) ×3 IMPLANT
ELECT BLADE 4.0 EZ CLEAN MEGAD (MISCELLANEOUS) ×3
ELECT BLADE 6.5 EXT (BLADE) IMPLANT
ELECT COATED BLADE 2.86 ST (ELECTRODE) ×3 IMPLANT
ELECT REM PT RETURN 9FT ADLT (ELECTROSURGICAL) ×3
ELECTRODE BLDE 4.0 EZ CLN MEGD (MISCELLANEOUS) ×3 IMPLANT
ELECTRODE REM PT RTRN 9FT ADLT (ELECTROSURGICAL) ×3 IMPLANT
EVACUATOR SILICONE 100CC (DRAIN) IMPLANT
FUNNEL KELLER 2 DISP (MISCELLANEOUS) IMPLANT
GAUZE PAD ABD 8X10 STRL (GAUZE/BANDAGES/DRESSINGS) ×6 IMPLANT
GAUZE SPONGE 4X4 12PLY STRL LF (GAUZE/BANDAGES/DRESSINGS) IMPLANT
GLOVE BIO SURGEON STRL SZ 6.5 (GLOVE) ×6 IMPLANT
GOWN STRL REUS W/ TWL LRG LVL3 (GOWN DISPOSABLE) ×9 IMPLANT
GOWN STRL REUS W/TWL LRG LVL3 (GOWN DISPOSABLE) ×9
IMPL BREAST P6.5XULT HI 650 (Breast) IMPLANT
IMPL BRST P6.5XULT HI 650CC (Breast) ×3 IMPLANT
IMPLANT BREAST GEL 650CC (Breast) ×3 IMPLANT
IV NS 1000ML (IV SOLUTION)
IV NS 1000ML BAXH (IV SOLUTION) IMPLANT
IV NS 500ML (IV SOLUTION) ×3
IV NS 500ML BAXH (IV SOLUTION) ×3 IMPLANT
KIT FILL ASEPTIC TRANSFER (MISCELLANEOUS) IMPLANT
NDL HYPO 25X1 1.5 SAFETY (NEEDLE) ×3 IMPLANT
NDL SAFETY ECLIP 18X1.5 (MISCELLANEOUS) ×3 IMPLANT
NEEDLE HYPO 25X1 1.5 SAFETY (NEEDLE) ×3 IMPLANT
NS IRRIG 1000ML POUR BTL (IV SOLUTION) IMPLANT
PACK BASIN DAY SURGERY FS (CUSTOM PROCEDURE TRAY) ×3 IMPLANT
PENCIL SMOKE EVACUATOR (MISCELLANEOUS) ×3 IMPLANT
PIN SAFETY STERILE (MISCELLANEOUS) IMPLANT
SIZER BREAST REUSE 590CC (SIZER) ×3
SIZER BREAST REUSE 650CC (SIZER) ×3
SIZER BRST REUSE 590CC (SIZER) IMPLANT
SIZER BRST REUSE P6.4 650CC (SIZER) IMPLANT
SLEEVE SCD COMPRESS KNEE MED (STOCKING) ×3 IMPLANT
SPIKE FLUID TRANSFER (MISCELLANEOUS) IMPLANT
SPONGE T-LAP 18X18 ~~LOC~~+RFID (SPONGE) ×6 IMPLANT
STRIP SUTURE WOUND CLOSURE 1/2 (MISCELLANEOUS) IMPLANT
SUT MNCRL AB 3-0 PS2 18 (SUTURE) IMPLANT
SUT MNCRL AB 4-0 PS2 18 (SUTURE) ×3 IMPLANT
SUT MON AB 3-0 SH 27 (SUTURE) ×12
SUT MON AB 3-0 SH27 (SUTURE) ×3 IMPLANT
SUT MON AB 5-0 PS2 18 (SUTURE) ×3 IMPLANT
SUT PDS 3-0 CT2 (SUTURE) ×12
SUT PDS AB 2-0 CT2 27 (SUTURE) IMPLANT
SUT PDS II 3-0 CT2 27 ABS (SUTURE) IMPLANT
SUT SILK 3 0 PS 1 (SUTURE) IMPLANT
SUT VIC AB 3-0 SH 27 (SUTURE) ×3
SUT VIC AB 3-0 SH 27X BRD (SUTURE) ×3 IMPLANT
SUT VICRYL 4-0 PS2 18IN ABS (SUTURE) ×3 IMPLANT
SYR BULB IRRIG 60ML STRL (SYRINGE) ×3 IMPLANT
SYR CONTROL 10ML LL (SYRINGE) IMPLANT
TOWEL GREEN STERILE FF (TOWEL DISPOSABLE) ×6 IMPLANT
TRAY DSU PREP LF (CUSTOM PROCEDURE TRAY) ×3 IMPLANT
TUBE CONNECTING 20X1/4 (TUBING) ×3 IMPLANT
UNDERPAD 30X36 HEAVY ABSORB (UNDERPADS AND DIAPERS) ×6 IMPLANT
YANKAUER SUCT BULB TIP NO VENT (SUCTIONS) ×3 IMPLANT

## 2022-04-10 NOTE — Anesthesia Procedure Notes (Signed)
Procedure Name: LMA Insertion Date/Time: 04/10/2022 8:35 AM  Performed by: Willa Frater, CRNAPre-anesthesia Checklist: Patient identified, Emergency Drugs available, Suction available and Patient being monitored Patient Re-evaluated:Patient Re-evaluated prior to induction Oxygen Delivery Method: Circle system utilized Preoxygenation: Pre-oxygenation with 100% oxygen Induction Type: IV induction Ventilation: Mask ventilation without difficulty LMA: LMA inserted LMA Size: 4.0 Number of attempts: 1 Airway Equipment and Method: Bite block Placement Confirmation: positive ETCO2 Tube secured with: Tape Dental Injury: Teeth and Oropharynx as per pre-operative assessment

## 2022-04-10 NOTE — Op Note (Addendum)
Op report Unilateral Breast Exchange   DATE OF OPERATION:  04/10/2022  LOCATION: [facility]  SURGICAL DIVISION: Plastic Surgery  PREOPERATIVE DIAGNOSES:  1. History of left upper outer quadrant breast cancer (Estrogen negative).  2. Acquired absence of left breast.  3. Breast asymmetry of right breast  POSTOPERATIVE DIAGNOSES:  Same as preoperative diagnosis.   PROCEDURE:  1. Exchange of left tissue expander for implant. 2. Right breast reduction 70 gm 3. Removal of port-a-cath  4. Capsulotomies for implant respositioning.  SURGEON: Kellee Sittner Sanger Karisa Nesser, DO  ASSISTANT: Donnamarie Rossetti, PA  ANESTHESIA:  General.   COMPLICATIONS: None.   IMPLANTS: [employer] Smooth Round Ultra High Profile Gel 650 cc. Ref #[id number].  Serial Number [id number]  INDICATIONS FOR PROCEDURE:  The patient, Kaitlyn Santana, is a 66 y.o. female born on 07-10-1955, is here for further treatment after a mastectomy and placement of a tissue expander. She now presents for exchange of her expander for an implant.  She requires capsulotomies to better position the implant. MRN: [id number]  CONSENT:  Informed consent was obtained directly from the patient. Risks, benefits and alternatives were fully discussed. Specific risks including but not limited to bleeding, infection, hematoma, seroma, scarring, pain, implant infection, implant extrusion, capsular contracture, asymmetry, wound healing problems, and need for further surgery were all discussed. The patient did have an ample opportunity to have her questions answered to her satisfaction.   DESCRIPTION OF PROCEDURE:  The patient was taken to the operating room. SCDs were placed and IV antibiotics were given. The patient's chest was prepped and draped in a sterile fashion. A time out was performed and the implants to be used were identified.  One percent Xylocaine with epinephrine was used to infiltrate the area.   Left:  Local was  placed at the inframammary fold.  The #15 blade was used to open the skin.  The bovie was used to dissect to the capsule.  The capsule was opened with the electrocautery and the expander drained of the normal saline.  The expander was removed.  The pocket was irrigated with normal saline.  The sizers were used to select the implant size.  The capsule was released at the inframammary fold.  New gloves were placed. The implant was placed and had a nice appearance.  The capsule was closed with a 3-0 PDS suture. The remaining skin was closed with 3-0 PDS followed by the 3-0 Monocryl and 4-0 Monocryl subcuticular stitches.  Dermabond was applied.    Right side: Preoperative markings were confirmed.  Incision lines were injected with local with epinephrine.  After waiting for vasoconstriction, the marked lines were incised.  A Wise-pattern superomedial breast reduction was performed by de-epithelializing the pedicle, using bovie to create the superomedial pedicle, and removing breast tissue from the superior, lateral, and inferior portions of the breast.  Care was taken to not undermine the breast pedicle. Hemostasis was achieved.  The nipple was gently rotated into position and the soft tissue closed with 4-0 Monocryl.   The pocket was irrigated and hemostasis confirmed.  The deep tissues were approximated with 3-0 PDS and 3-0 Monocryl sutures and the skin was closed with deep dermal and subcuticular 4-0 Monocryl sutures.  The nipple and skin flaps had good capillary refill at the end of the procedure.    The advanced practice practitioner (APP) assisted throughout the case.  The APP was essential in retraction and counter traction when needed to make the case progress smoothly.  This  retraction and assistance made it possible to see the tissue plans for the procedure.  The assistance was needed for blood control, tissue re-approximation and assisted with closure of the incision site.

## 2022-04-10 NOTE — Interval H&P Note (Signed)
History and Physical Interval Note:  04/10/2022 7:44 AM  Kaitlyn Santana  has presented today for surgery, with the diagnosis of S/P breast reconstruction.  The various methods of treatment have been discussed with the patient and family. After consideration of risks, benefits and other options for treatment, the patient has consented to  Procedure(s): REMOVAL OF LEFT TISSUE EXPANDER AND PLACEMENT OF IMPLANT (Left) Right Breast mastopexy for symmetry (Right) as a surgical intervention.  The patient's history has been reviewed, patient examined, no change in status, stable for surgery.  I have reviewed the patient's chart and labs.  Questions were answered to the patient's satisfaction.     Loel Lofty Kaitlyn Santana

## 2022-04-10 NOTE — Anesthesia Postprocedure Evaluation (Signed)
Anesthesia Post Note  Patient: Kariel A Kye  Procedure(s) Performed: REMOVAL OF LEFT TISSUE EXPANDER AND PLACEMENT OF IMPLANT (Left: Breast) Right Breast mastopexy for symmetry (Right: Breast) REMOVAL PORT-A-CATH (Right: Chest)     Patient location during evaluation: PACU Anesthesia Type: General Level of consciousness: awake and alert Pain management: pain level controlled Vital Signs Assessment: post-procedure vital signs reviewed and stable Respiratory status: spontaneous breathing, nonlabored ventilation, respiratory function stable and patient connected to nasal cannula oxygen Cardiovascular status: blood pressure returned to baseline and stable Postop Assessment: no apparent nausea or vomiting Anesthetic complications: no   No notable events documented.  Last Vitals:  Vitals:   04/10/22 1045 04/10/22 1058  BP: 98/69 110/85  Pulse: 77 78  Resp: 16 16  Temp:  36.5 C  SpO2: 92% 96%    Last Pain:  Vitals:   04/10/22 1058  TempSrc: Oral  PainSc: 0-No pain                 Arda Keadle

## 2022-04-10 NOTE — Transfer of Care (Signed)
Immediate Anesthesia Transfer of Care Note  Patient: Amberlie A Disanti  Procedure(s) Performed: REMOVAL OF LEFT TISSUE EXPANDER AND PLACEMENT OF IMPLANT (Left: Breast) Right Breast mastopexy for symmetry (Right: Breast) REMOVAL PORT-A-CATH (Right: Chest)  Patient Location: PACU  Anesthesia Type:General  Level of Consciousness: awake, alert , oriented, drowsy and patient cooperative  Airway & Oxygen Therapy: Patient Spontanous Breathing and Patient connected to face mask oxygen  Post-op Assessment: Report given to RN and Post -op Vital signs reviewed and stable  Post vital signs: Reviewed and stable  Last Vitals:  Vitals Value Taken Time  BP 103/71 04/10/22 1020  Temp    Pulse 85 04/10/22 1021  Resp 19 04/10/22 1021  SpO2 93 % 04/10/22 1021  Vitals shown include unvalidated device data.  Last Pain:  Vitals:   04/10/22 0708  TempSrc: Oral  PainSc: 4       Patients Stated Pain Goal: 7 (27/74/12 8786)  Complications: No notable events documented.

## 2022-04-10 NOTE — Discharge Instructions (Addendum)
INSTRUCTIONS FOR AFTER BREAST SURGERY   You will likely have some questions about what to expect following your operation.  The following information will help you and your family understand what to expect when you are discharged from the hospital.  It is important to follow these guidelines to help ensure a smooth recovery and reduce complication.  Postoperative instructions include information on: diet, wound care, medications and physical activity.  AFTER SURGERY Expect to go home after the procedure.  In some cases, you may need to spend one night in the hospital for observation.  DIET Breast surgery does not require a specific diet.  However, the healthier you eat the better your body will heal. It is important to increasing your protein intake.  This means limiting the foods with sugar and carbohydrates.  Focus on vegetables and some meat.  If you have liposuction during your procedure be sure to drink water.  If your urine is bright yellow, then it is concentrated, and you need to drink more water.  As a general rule after surgery, you should have 8 ounces of water every hour while awake.  If you find you are persistently nauseated or unable to take in liquids let us know.  NO TOBACCO USE or EXPOSURE.  This will slow your healing process and lead to a wound.  WOUND CARE Leave the binder on for 3 days . Use fragrance free soap like Dial, Dove or [place].   After 3 days you can remove the binder to shower. Once dry apply binder or sports bra. If you have liposuction you will have a soft and spongy dressing (Lipofoam) that helps prevent creases in your skin.  Remove before you shower and then replace it.  It is also available on [employer]. If you have steri-strips / tape directly attached to your skin leave them in place. It is OK to get these wet.   No baths, pools or hot tubs for four weeks. We close your incision to leave the smallest and best-looking scar. No ointment or creams on your incisions  for four weeks.  No Neosporin (Too many skin reactions).  A few weeks after surgery you can use Mederma and start massaging the scar. We ask you to wear your binder or sports bra for the first 6 weeks around the clock, including while sleeping. This provides added comfort and helps reduce the fluid accumulation at the surgery site. No Ice or heating pads to the operative site.  You have a very high risk of a burn before you feel the temperature change.  ACTIVITY No heavy lifting until cleared by the doctor.  This usually means no more than a half-gallon of milk.  It is OK to walk and climb stairs. Moving your legs is very important to decrease your risk of a blood clot.  It will also help keep you from getting deconditioned.  Every 1 to 2 hours get up and walk for 5 minutes. This will help with a quicker recovery back to normal.  Let pain be your guide so you don't do too much.  This time is for you to recover.  You will be more comfortable if you sleep and rest with your head elevated either with a few pillows under you or in a recliner.  No stomach sleeping for a three months.  WORK Everyone returns to work at different times. As a rough guide, most people take at least 1 - 2 weeks off prior to returning to work. If  you need documentation for your job, give the forms to the front staff at the clinic.  DRIVING Arrange for someone to bring you home from the hospital after your surgery.  You may be able to drive a few days after surgery but not while taking any narcotics or valium.  BOWEL MOVEMENTS Constipation can occur after anesthesia and while taking pain medication.  It is important to stay ahead for your comfort.  We recommend taking Milk of Magnesia (2 tablespoons; twice a day) while taking the pain pills.  MEDICATIONS You may be prescribed should start after surgery At your preoperative visit for you history and physical you may have been given the following medications: An antibiotic: Start  this medication when you get home and take according to the instructions on the bottle. Zofran 4 mg:  This is to treat nausea and vomiting.  You can take this every 6 hours as needed and only if needed. Valium 2 mg for breast cancer patients: This is for muscle tightness if you have an implant or expander. This will help relax your muscle which also helps with pain control.  This can be taken every 12 hours as needed. Don't drive after taking this medication. Norco (hydrocodone/acetaminophen) 5/325 mg:  This is only to be used after you have taken the Motrin or the Tylenol. Every 8 hours as needed.   Over the counter Medication to take: Ibuprofen (Motrin) 600 mg:  Take this every 6 hours.  If you have additional pain then take 500 mg of the Tylenol every 8 hours.  Only take the Norco after you have tried these two. MiraLAX or Milk of Magnesia: Take this according to the bottle if you take the [place] Call your surgeon's office if any of the following occur: Fever 101 degrees F or greater Excessive bleeding or fluid from the incision site. Pain that increases over time without aid from the medications Redness, warmth, or pus draining from incision sites Persistent nausea or inability to take in liquids Severe misshapen area that underwent the operation.   Post Anesthesia Home Care Instructions  Activity: Get plenty of rest for the remainder of the day. A responsible individual must stay with you for 24 hours following the procedure.  For the next 24 hours, DO NOT: -Drive a car -Paediatric nurse -Drink alcoholic beverages -Take any medication unless instructed by your physician -Make any legal decisions or sign important papers.  Meals: Start with liquid foods such as gelatin or soup. Progress to regular foods as tolerated. Avoid greasy, spicy, heavy foods. If nausea and/or vomiting occur, drink only clear liquids until the nausea and/or vomiting subsides. Call your  physician if vomiting continues.  Special Instructions/Symptoms: Your throat may feel dry or sore from the anesthesia or the breathing tube placed in your throat during surgery. If this causes discomfort, gargle with warm salt water. The discomfort should disappear within 24 hours.  If you had a scopolamine patch placed behind your ear for the management of post- operative nausea and/or vomiting:  1. The medication in the patch is effective for 72 hours, after which it should be removed.  Wrap patch in a tissue and discard in the trash. Wash hands thoroughly with soap and water. 2. You may remove the patch earlier than 72 hours if you experience unpleasant side effects which may include dry mouth, dizziness or visual disturbances. 3. Avoid touching the patch. Wash your hands with soap and water after contact with the  patch.       After 1:15 can take tylenol after 3:15 can take Ibuprofen

## 2022-04-11 ENCOUNTER — Encounter (HOSPITAL_BASED_OUTPATIENT_CLINIC_OR_DEPARTMENT_OTHER): Payer: Self-pay | Admitting: Plastic Surgery

## 2022-04-11 LAB — SURGICAL PATHOLOGY

## 2022-04-12 ENCOUNTER — Encounter (HOSPITAL_BASED_OUTPATIENT_CLINIC_OR_DEPARTMENT_OTHER): Payer: Self-pay | Admitting: Plastic Surgery

## 2022-04-18 ENCOUNTER — Ambulatory Visit (INDEPENDENT_AMBULATORY_CARE_PROVIDER_SITE_OTHER): Payer: PPO | Admitting: Physician Assistant

## 2022-04-18 VITALS — BP 144/94 | HR 77 | Temp 98.2°F | Resp 18

## 2022-04-18 DIAGNOSIS — C50412 Malignant neoplasm of upper-outer quadrant of left female breast: Secondary | ICD-10-CM

## 2022-04-18 DIAGNOSIS — Z171 Estrogen receptor negative status [ER-]: Secondary | ICD-10-CM

## 2022-04-18 NOTE — Progress Notes (Addendum)
Patient is a very pleasant 66 year old with PMH of left-sided mastectomy s/p left-sided implant exchange with right-sided mastopexy for symmetry performed 04/10/2022 by Dr. Marla Roe who presents to clinic for postoperative follow-up.  Reviewed operative report.  Capsulotomies were performed for implant repositioning.  A 650 cc ultrahigh profile gel implant was placed.  Right-sided mastopexy with 70 g tissue removal for symmetry.  Today, patient called the office and requested to be seen for a spreading itchy red rash across her chest.  She tells me that she felt fine postoperatively until she woke up this morning with a red itchy rash in the center of her chest which has since spread.  She states that her entire torso is now red and itchy.  She has not taking any medications since onset of her rash.  She denies any fevers, difficulty breathing, sore throat or throat closing sensation, nausea or upset stomach, or wheezing.  She completed her course of clindamycin earlier this week and has only been taking Tylenol/ibuprofen for her symptoms of discomfort.  She denies use of any new compression bras or laundry detergent.  On physical exam, honeycomb dressings are removed.  Port site appears to be well-healed.  Steri-Strips remain firmly intact on breasts bilaterally.  Right NAC is viable.  No obvious seroma or hematoma.  Good symmetry.  Erythematous maculopapular rash diffusely over torso, predominantly in a bra pattern extending across axilla bilaterally.  See pictures obtained in chart.  No audible wheezing.  Patent oropharynx.  Speaks in full sentences.  Vital signs normal.  Patient appears to have a delayed hypersensitivity reaction versus drug reaction, unclear etiology.  She was prescribed clindamycin for postoperative antibiotics which she completed several days ago.  Her reaction could also be result of medications given intraoperatively or the prep that was used.  Her distribution is strictly on the  torso and she tells me that she has been using the presurgical soap exclusively in that area.  Told her to discontinue and use her normal soap moving forward.  She is also no longer taking any medications aside from ibuprofen and Tylenol which she has taken in the past without issue or complication.  She has also had Steri-Strips on in the past without similar reaction which is why decision is made to leave them in place today.  The overlying honeycomb dressings were removed in the event that they were causing a tape dermatitis.  Instructed patient to take a second-generation nondrowsy antihistamine such as 10 mg Zyrtec in the morning and then 50 mg Benadryl in the evening to treat her itching and rash.  She understands that if she develops any sore throat/throat closing sensation/wheezing/difficulty breathing, she will need to go straight to the emergency room for IV steroids and possible epinephrine.  Otherwise, I will have patient call the office tomorrow to see how it improves with antihistamines.  If she is persistently rash without considerable improvement, will at that time consider a 5-day prednisone burst, but would prefer to avoid if possible given that it could impair wound healing.  Patient voices understanding and is agreeable to the plan.  She will call clinic should she have any questions or concerns.  Otherwise, plan to speak with her tomorrow and then see her again in office next week.

## 2022-04-18 NOTE — Progress Notes (Signed)
The patient, Kaitlyn Santana, gave permission to have this encounter performed via telemedicine, two identifiers were used to confirm patient's identity.  They also consented to chart review and treatment via this encounter.  The patient was at home in [facility] and this provider was calling from their office.    Patient is a very pleasant 66 year old with PMH of left-sided mastectomy s/p left-sided implant exchange with right-sided mastopexy for symmetry performed 04/10/2022 by Dr. Marla Roe who presents to clinic for postoperative follow-up.  Reviewed operative report.  Capsulotomies were performed for implant repositioning.  A 650 cc ultrahigh profile gel implant was placed.  Right-sided mastopexy with 70 g tissue removal for symmetry.  She was seen yesterday in clinic after developing a spreading itchy red rash across her chest and torso.  No other symptoms otherwise concerning for anaphylaxis.  She had already completed her course of clindamycin.  She had been using her presurgical soap exclusively in the area and was told to discontinue.  She denies taking any medications.  Advised twice daily antihistamines, but wanted to abstain from systemic steroids to see if it would improve with antihistamines alone.   Today, patient tells me that she took the 50 mg Benadryl last evening and took her second-generation antihistamine this morning.  She continues to have persistent redness and itching across the chest and back.  She continues to deny any wheezing, shortness of breath, cough, sore throat or throat closing sensation, nausea, spreading rash, facial or extremity involvement, or other symptoms.  Advised a 50 mg prednisone burst x5 days for her delayed hypersensitivity reaction/drug reaction/dermatitis.  She has taken and tolerated steroids in the past.  Patient tells me that she would prefer to continue holding off on the initiation of steroid treatment until she sees if there is any improvement with antihistamines  alone.  Patient was also advised to stop wearing her sports bra and transition back to the breast binder to see if the bra is causing her outbreak.    She understands that if she develops any of the symptoms that we discussed are concerning for anaphylaxis, she will need to go to the ER immediately for treatment.  She will continue to closely observe her symptoms and to begin her prednisone burst if she fails to see improvement.  She knows that she can call the clinic/on-call service over the weekend should she have any questions or concerns.

## 2022-04-19 ENCOUNTER — Encounter: Payer: PPO | Admitting: Plastic Surgery

## 2022-04-19 ENCOUNTER — Ambulatory Visit (INDEPENDENT_AMBULATORY_CARE_PROVIDER_SITE_OTHER): Payer: PPO | Admitting: Physician Assistant

## 2022-04-19 DIAGNOSIS — Z171 Estrogen receptor negative status [ER-]: Secondary | ICD-10-CM

## 2022-04-19 DIAGNOSIS — C50412 Malignant neoplasm of upper-outer quadrant of left female breast: Secondary | ICD-10-CM

## 2022-04-19 MED ORDER — PREDNISONE 50 MG PO TABS: 50.0000 mg | ORAL_TABLET | Freq: Every day | ORAL | 0 refills | Status: AC

## 2022-04-22 NOTE — Progress Notes (Unsigned)
Patient is a very pleasant 66 year old with PMH of left-sided mastectomy s/p left-sided implant exchange with right-sided mastopexy for symmetry performed 04/10/2022 by Dr. Marla Roe who presents to clinic for postoperative follow-up.  Capsulotomies were performed for implant repositioning.  A 650 cc ultrahigh profile gel implant was placed.  Right-sided mastopexy with 70 g tissue removal for symmetry.   She was last seen here in office on 04/18/2022.  At that time, she was complaining of a pruritic erythematous rash across her chest, back, and axilla.  Exam was consistent with a delayed hypersensitive reaction versus drug reaction, unclear etiology.  She was encouraged to take a second-generation antihistamine daily and then 50 mg Benadryl in the evening.  She was without any symptoms concerning for anaphylaxis.  Her surgical exam was otherwise benign. She then called into the office the following day, as scheduled, and did not feel any considerable improvement so she was prescribed a prednisone burst.  Today, patient tells me that her rash went away on antihistamines alone and she did not have to initiate treatment with the prednisone burst.  She feels as though she is healing and recovering nicely from her surgery.  She is ready for the Steri-Strips to be removed.  She does express some minor concerns regarding the shape and contour of her surgery.  Patient feels as though the right breast may be a bit outward deviated and on the left breast reconstruction she inquires about some excess skin medially as well as feels as though the implant is high on her chest.  However, patient does not feel as though she would like to proceed with any revision or additional surgical intervention.  On physical exam, she does have relatively good shape and symmetry of the breasts, significantly improved compared to preoperative state.  The right breast reduction and mastopexy appears well-healed.  NAC is viable.  Steri-Strips  removed, incisions CDI.  Left breast implant site also appears to be well-healed with no obvious seroma or hematoma.  Steri-Strip removed from inframammary incision.  Both the inframammary incision and the mastectomy incision appears completely healed.  She does have some excess skin medially with mild pouch effect.  Patient may have some underlying scar tissue at mastectomy incision causing the implant to have considerable superior pole fullness.  Suspect that this will likely fall down and settle with time.  Overall, patient is pleased.  Discussed NAC tattooing in the future.  Plan for her to return in 2 weeks for likely final postoperative encounter and to discuss scar treatments.  Picture(s) obtained of the patient and placed in the chart were with the patient's or guardian's permission.

## 2022-04-24 ENCOUNTER — Ambulatory Visit (INDEPENDENT_AMBULATORY_CARE_PROVIDER_SITE_OTHER): Payer: PPO | Admitting: Physician Assistant

## 2022-04-24 DIAGNOSIS — C50412 Malignant neoplasm of upper-outer quadrant of left female breast: Secondary | ICD-10-CM

## 2022-04-24 DIAGNOSIS — Z171 Estrogen receptor negative status [ER-]: Secondary | ICD-10-CM

## 2022-04-30 ENCOUNTER — Telehealth: Payer: Self-pay

## 2022-04-30 ENCOUNTER — Inpatient Hospital Stay: Payer: PPO | Attending: Adult Health | Admitting: Adult Health

## 2022-04-30 NOTE — Telephone Encounter (Signed)
Called pt in regards to her Halfway/ns. Pt states she does not wish to have SCP visit. She knows to call us if an concerns arise.

## 2022-05-01 ENCOUNTER — Encounter: Payer: PPO | Admitting: Physician Assistant

## 2022-05-03 ENCOUNTER — Encounter: Payer: Self-pay | Admitting: *Deleted

## 2022-05-07 NOTE — Progress Notes (Unsigned)
Patient is a very pleasant 66 year old with PMH of left-sided mastectomy s/p left-sided implant exchange with right-sided mastopexy for symmetry performed 04/10/2022 by Dr. Marla Roe who presents to clinic for postoperative follow-up.  Capsulotomies were performed for implant repositioning.  A 650 cc ultrahigh profile gel implant was placed.  Right-sided mastopexy with 70 g tissue removal for symmetry.   She was last seen here in clinic on 04/24/2022.  At that time, she did have some minor concerns regarding the shape and contour of her breasts, feeling as though the right breast may be a bit outward deviated and left breast reconstruction site had some excess skin medially with the superior pole fullness.  However, she states that overall she was pleased and did not feel as though she would want to proceed with any revision or additional surgical intervention.  Incisions all CDI.  No obvious seroma or hematoma.    Today, patient is doing well.  She states that she is pleased with the symmetry when she is wearing a bra.  She feels as though she has healed nicely since last encounter, but she does have a couple of protruding sutures that she would like to have removed.  She still endorses some concern regarding the excess skin medially on the left side as well as the right-sided deviation.  She feels as though she would not want to go to the OR for any contouring, but is less certain than she was at last visit.  She has been applying Vaseline to her incisions.  Physical exam is entirely reassuring.  Breasts with good shape and overall good symmetry.  Right NAC is healthy appearing.  Suture from 4 o'clock position is removed without complication or difficulty.  Vertical limb and inframammary incisions appear to be well-healed.  Left breast with superior pole fullness and persistent redundant skin medially.  Her mastectomy and inframammary incision have each healed well.  Suture removed from most lateral aspect  of inframammary incision left breast.  Discussed silicone scar treatments x 3 months.  Recommend that she follow-up with Dr. Marla Roe in 8 weeks to discuss surgical options for contouring.  At that time, if she is completely certain that she does not want to proceed with any additional surgeries, we can plan for nipple areolar restoration of left breast.  Picture(s) obtained of the patient and placed in the chart were with the patient's or guardian's permission.

## 2022-05-08 ENCOUNTER — Encounter: Payer: Self-pay | Admitting: Physician Assistant

## 2022-05-08 ENCOUNTER — Ambulatory Visit (INDEPENDENT_AMBULATORY_CARE_PROVIDER_SITE_OTHER): Payer: PPO | Admitting: Physician Assistant

## 2022-05-08 VITALS — BP 151/95 | HR 74

## 2022-05-08 DIAGNOSIS — Z9889 Other specified postprocedural states: Secondary | ICD-10-CM

## 2022-05-15 ENCOUNTER — Encounter: Payer: PPO | Admitting: Physician Assistant

## 2022-07-02 ENCOUNTER — Ambulatory Visit (INDEPENDENT_AMBULATORY_CARE_PROVIDER_SITE_OTHER): Payer: PPO | Admitting: Plastic Surgery

## 2022-07-02 DIAGNOSIS — C50412 Malignant neoplasm of upper-outer quadrant of left female breast: Secondary | ICD-10-CM

## 2022-07-02 DIAGNOSIS — N651 Disproportion of reconstructed breast: Secondary | ICD-10-CM

## 2022-07-02 NOTE — Progress Notes (Signed)
   Subjective:    Patient ID: Kaitlyn Santana, female    DOB: [birth year], 67 y.o.   MRN: [id number]  The patient is a 67 year old female here for follow-up after undergoing a right breast reduction for symmetry and placement of a left breast implant with removal of the expander.  The left mastectomy with start of reconstruction was in August 2023.  She then had removal of the expander and placement of the implant in November 2023.  She has a smooth round ultrahigh profile 650 cc implant and the right breast reduction was done at the same time.  She is extremely happy.  She says she fits into her close very nicely.  She is not sure if she wants to do a traditional tattoo or get something like a card and also she is going to think about it.      Review of Systems  Constitutional: Negative.   HENT: Negative.    Eyes: Negative.   Respiratory: Negative.    Cardiovascular: Negative.   Gastrointestinal: Negative.   Endocrine: Negative.   Genitourinary: Negative.   Musculoskeletal: Negative.        Objective:   Physical Exam Vitals and nursing note reviewed.  Constitutional:      Appearance: Normal appearance.  HENT:     Head: Normocephalic and atraumatic.  Cardiovascular:     Rate and Rhythm: Normal rate.     Pulses: Normal pulses.  Pulmonary:     Effort: Pulmonary effort is normal.  Abdominal:     Palpations: Abdomen is soft.  Skin:    General: Skin is warm.     Capillary Refill: Capillary refill takes less than 2 seconds.     Coloration: Skin is not jaundiced.  Neurological:     Mental Status: She is alert and oriented to person, place, and time.  Psychiatric:        Mood and Affect: Mood normal.        Behavior: Behavior normal.        Thought Content: Thought content normal.        Judgment: Judgment normal.        Assessment & Plan:     ICD-10-CM   1. Malignant neoplasm of upper-outer quadrant of left female breast, unspecified estrogen receptor status ([place])  C50.412      2. Breast asymmetry following reconstructive surgery  N65.1        I would like to see her back in 1 year.  In the meantime she is going to think about the nipple areola tattooing.  We also talked about fat grafting.  At this time she does not want to do any of that and is very happy with her results.  Pictures were obtained of the patient and placed in the chart with the patient's or guardian's permission.

## 2022-07-24 IMAGING — MG MM BREAST BX W LOC DEV 1ST LESION IMAGE BX SPEC STEREO GUIDE*L*
9 of 15 series · 9 of 35 positions shown · non-contrast
Comparison: Priors

CLINICAL DATA: Patient with indeterminate left breast
calcifications.

EXAM:
LEFT BREAST STEREOTACTIC CORE NEEDLE BIOPSY

[L (1 of 9)]
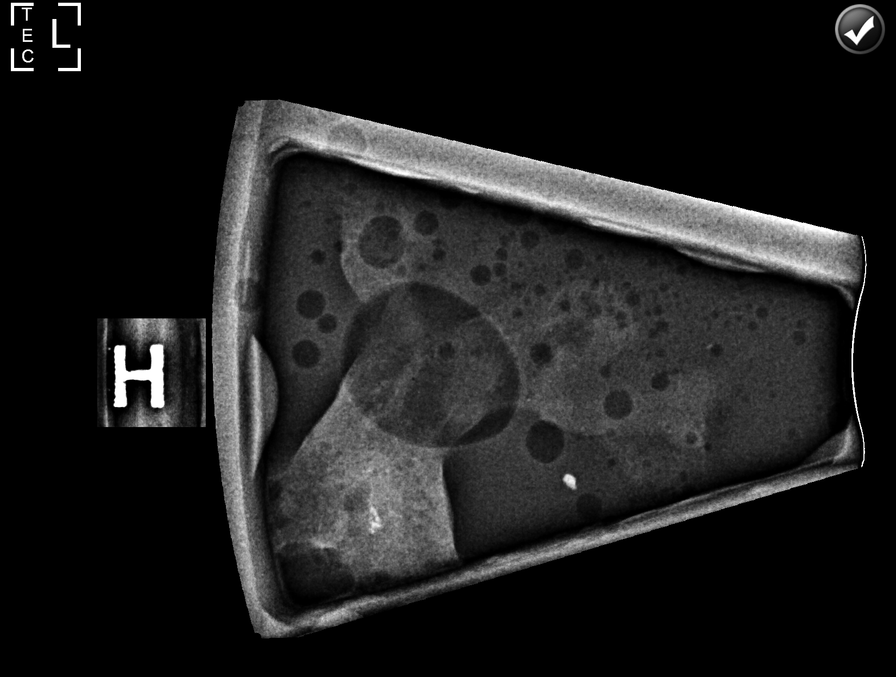

[L (2 of 9)]
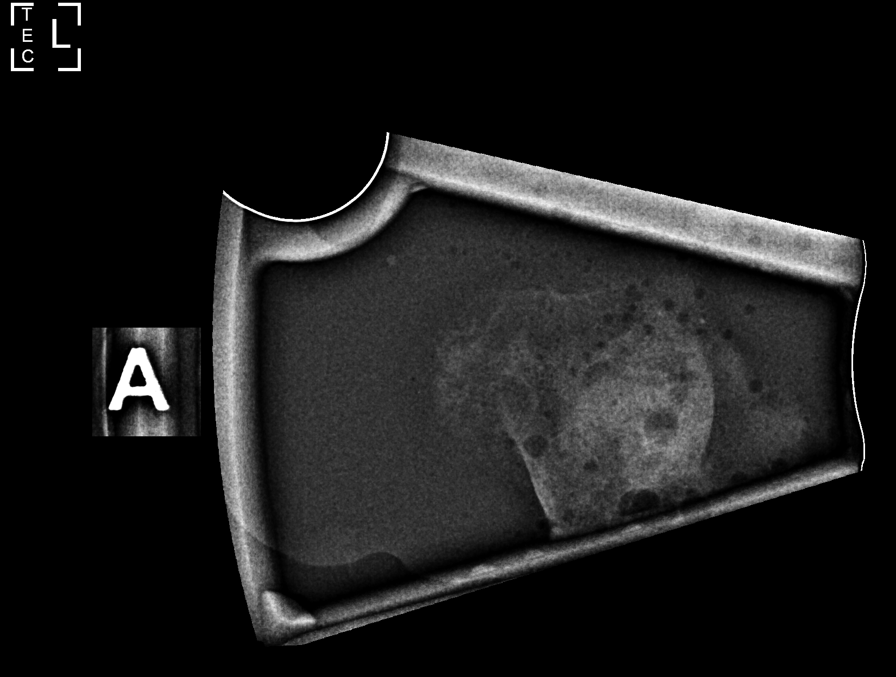

[L (3 of 9)]
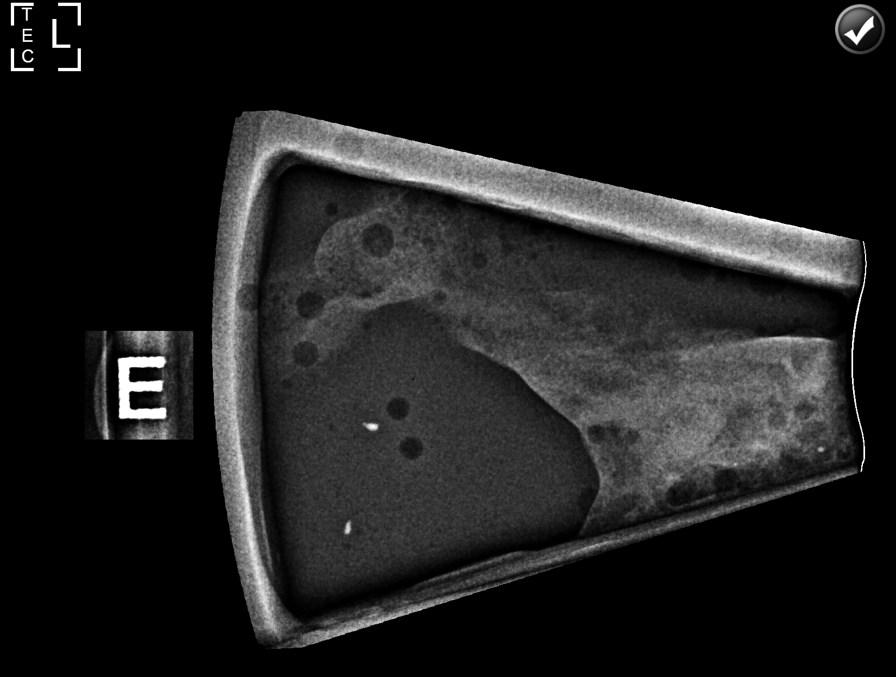

[L (4 of 9)]
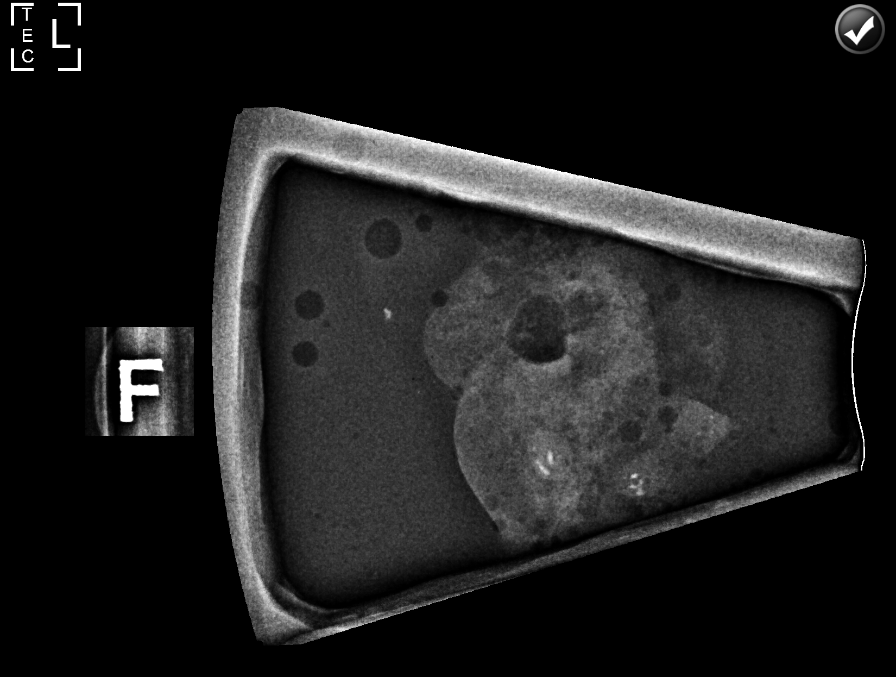

[L (5 of 9)]
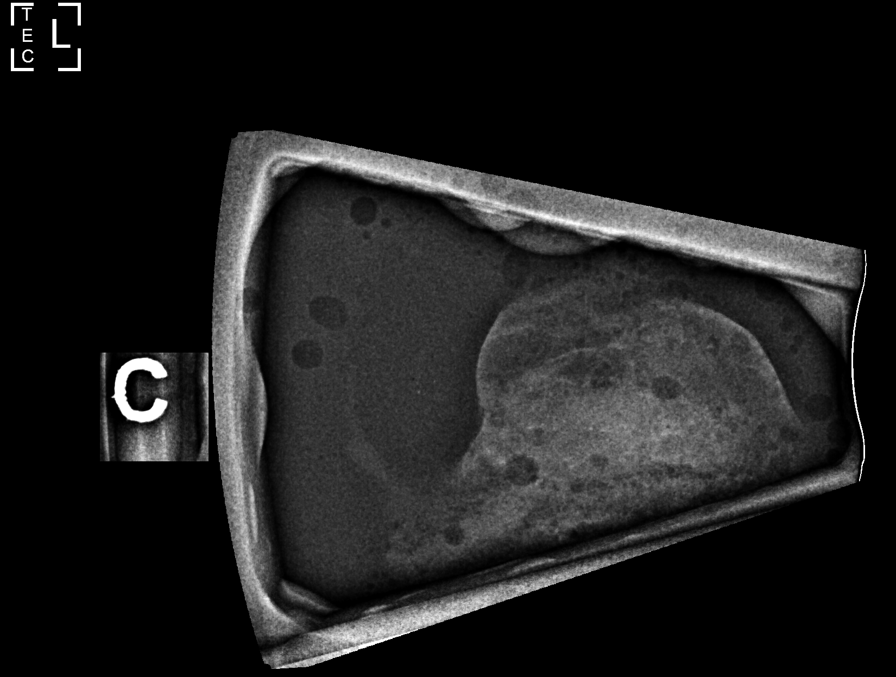

[L (6 of 9)]
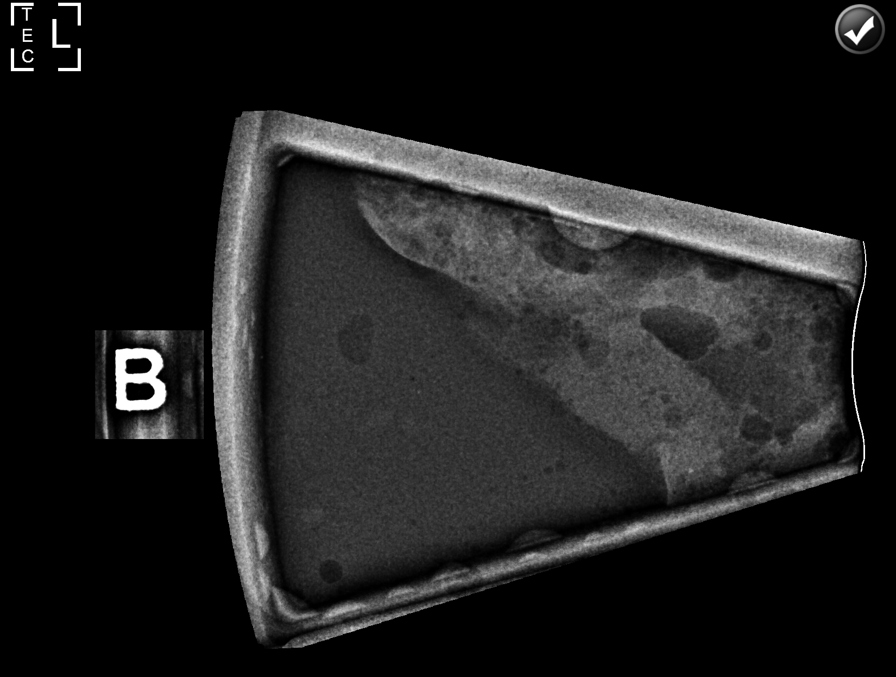

[L (7 of 9)]
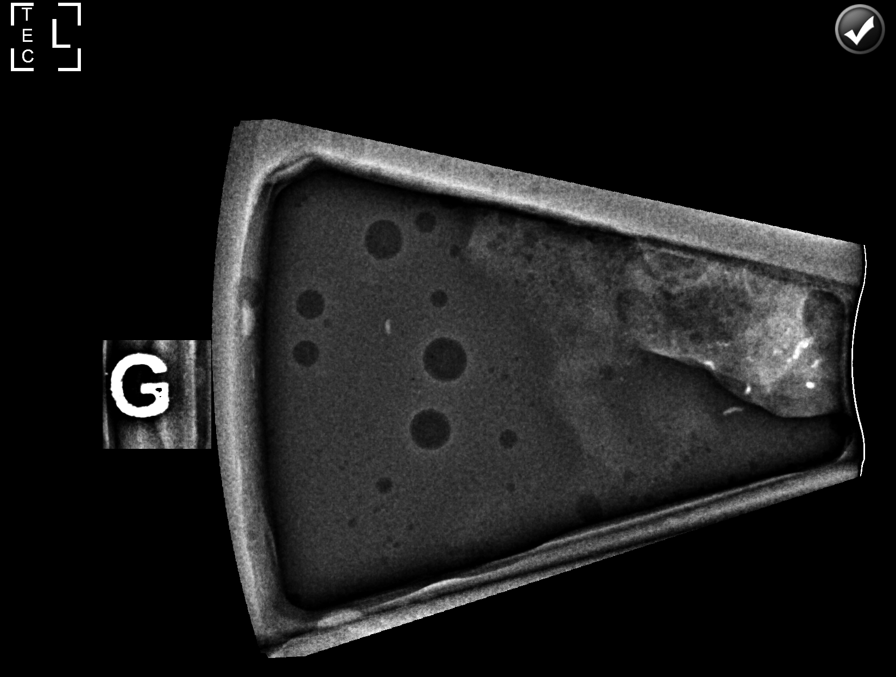

[L (8 of 9)]
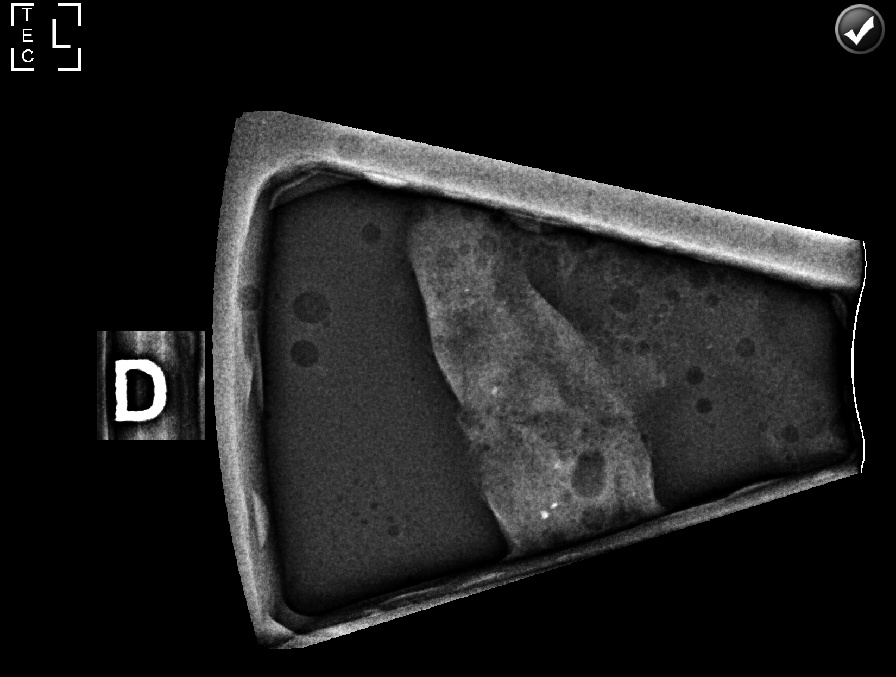

[L (9 of 9)]
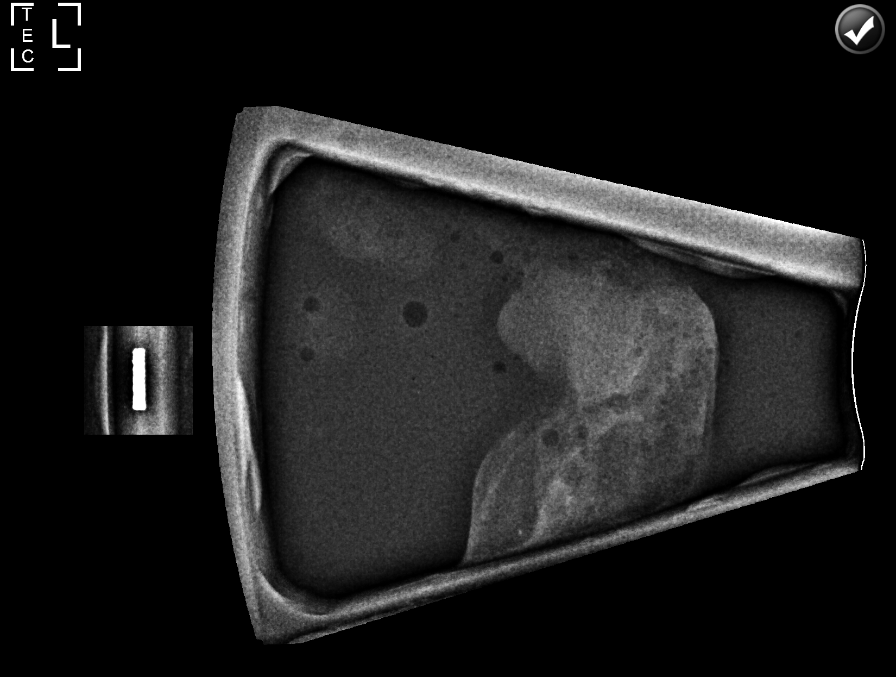

[9 of 35 positions shown; findings below may reference images not displayed]



Using sterile technique and 1% Lidocaine as local anesthetic, under
stereotactic guidance, a 9 gauge vacuum assisted device was used to
perform core needle biopsy of calcifications upper-outer left breast
using a lateral approach. Specimen radiograph was performed showing
calcification. Specimens with calcifications are identified for
pathology.

Lesion quadrant: Upper outer quadrant

At the conclusion of the procedure, X shaped tissue marker clip was
deployed into the biopsy cavity. Follow-up 2-view mammogram was
performed and dictated separately.
IMPRESSION: Stereotactic-guided biopsy of left breast calcifications. No
apparent complications.

## 2022-07-24 IMAGING — MG MM BREAST LOCALIZATION CLIP
4 series · 4 of 12 positions shown · non-contrast
Comparison: Previous exam(s).

CLINICAL DATA: Status post stereo biopsy left breast
calcifications.

EXAM:
3D DIAGNOSTIC LEFT MAMMOGRAM POST STEREOTACTIC BIOPSY

[L CC synth-2D]
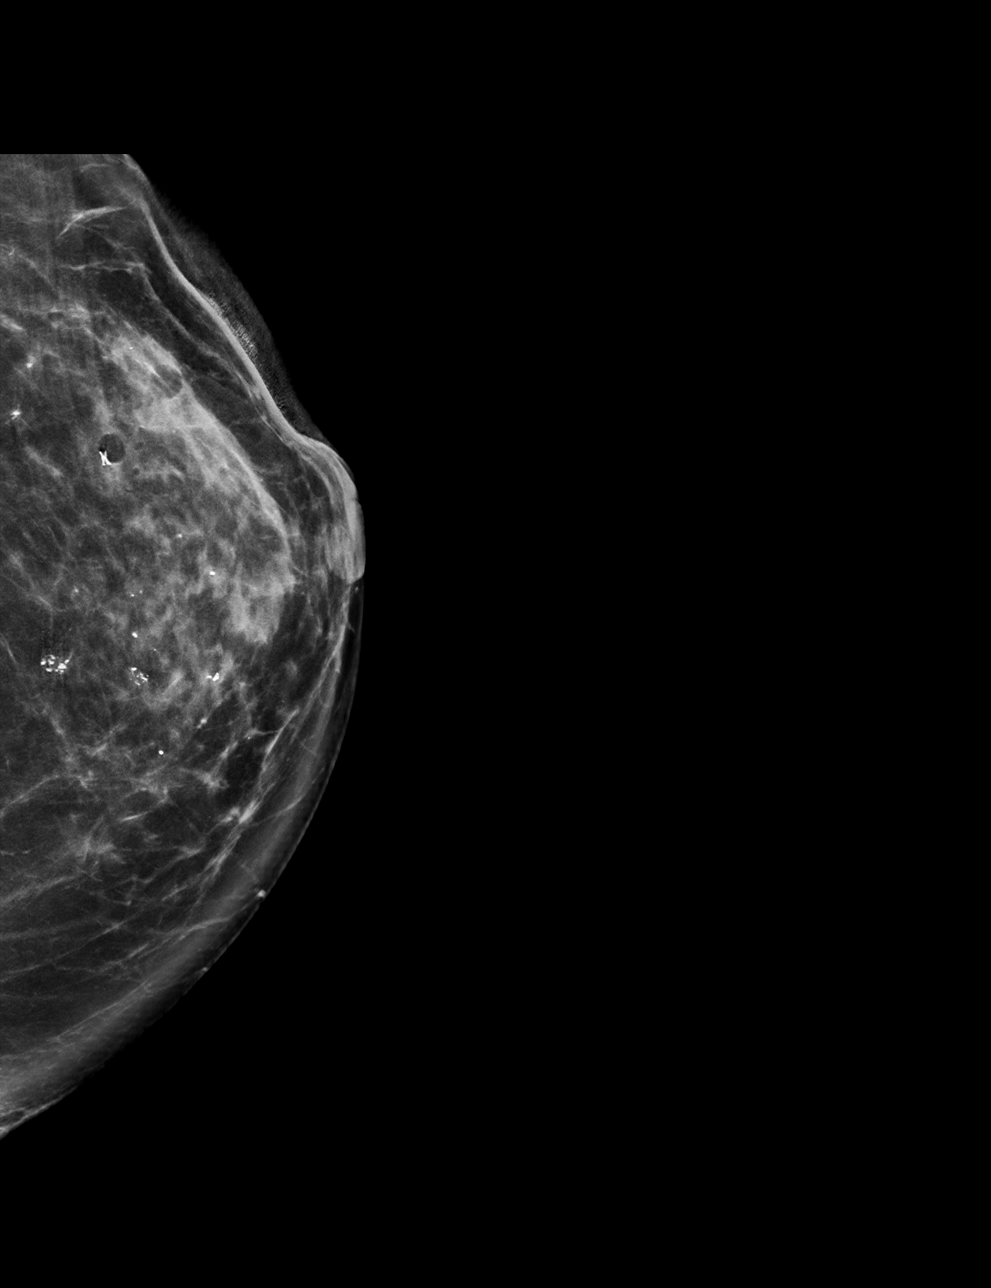

[L LM synth-2D]
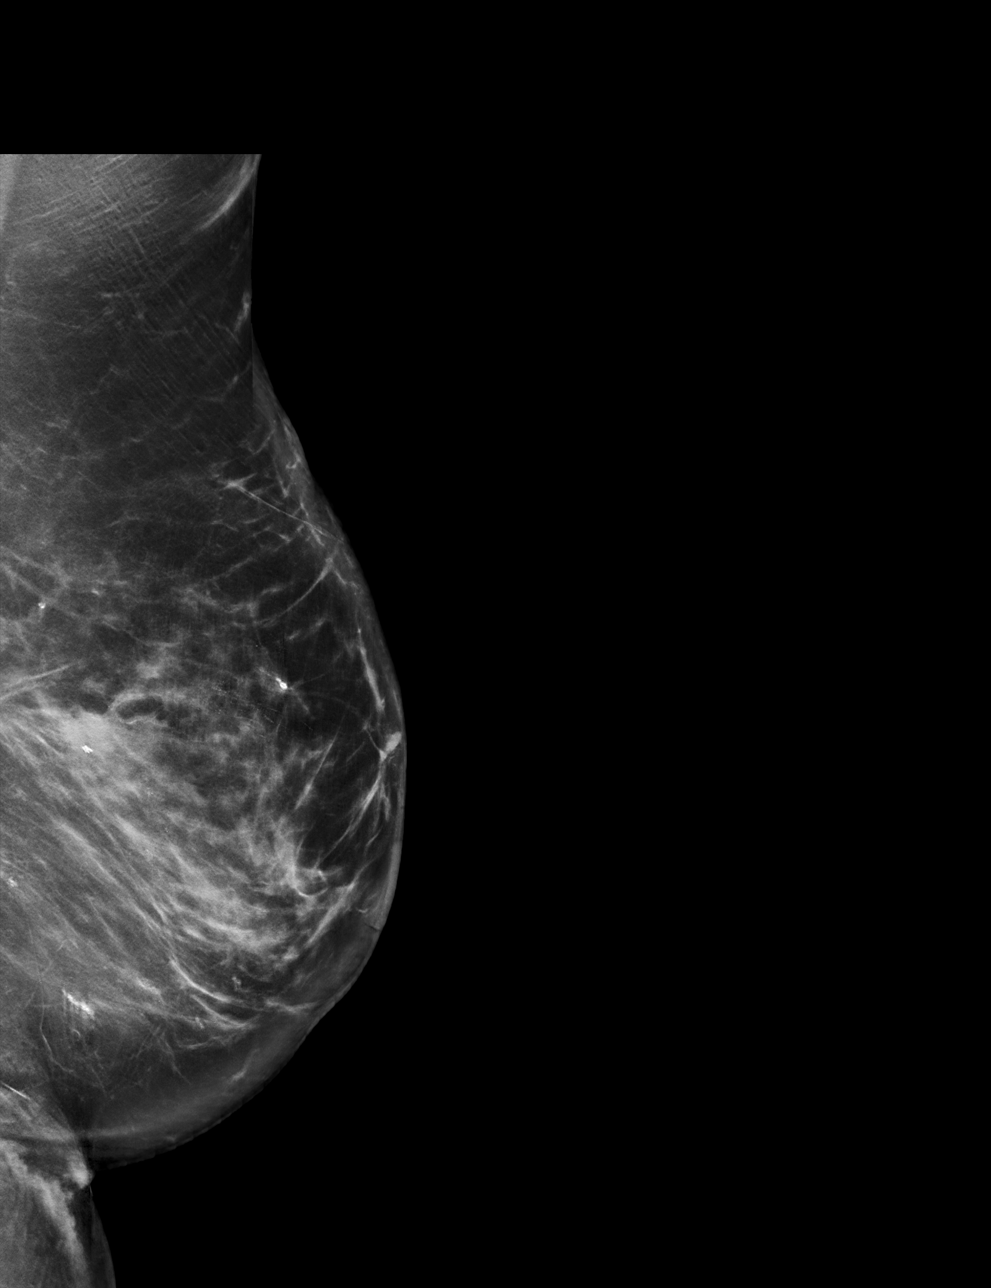

[L LM tomo · tomo slice 43/84.0]
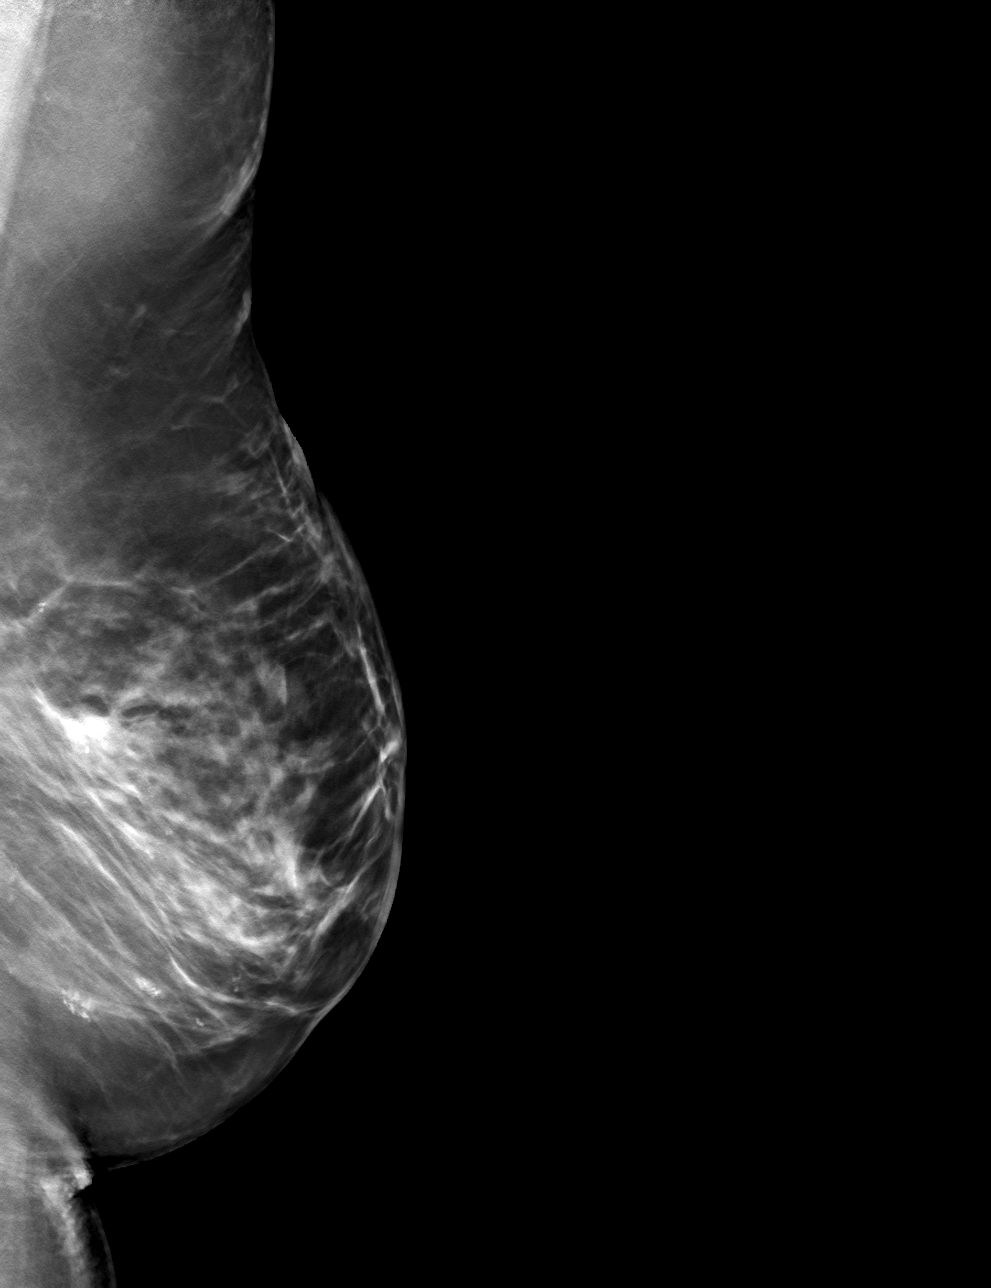

[L CC tomo · tomo slice 39/76.0]
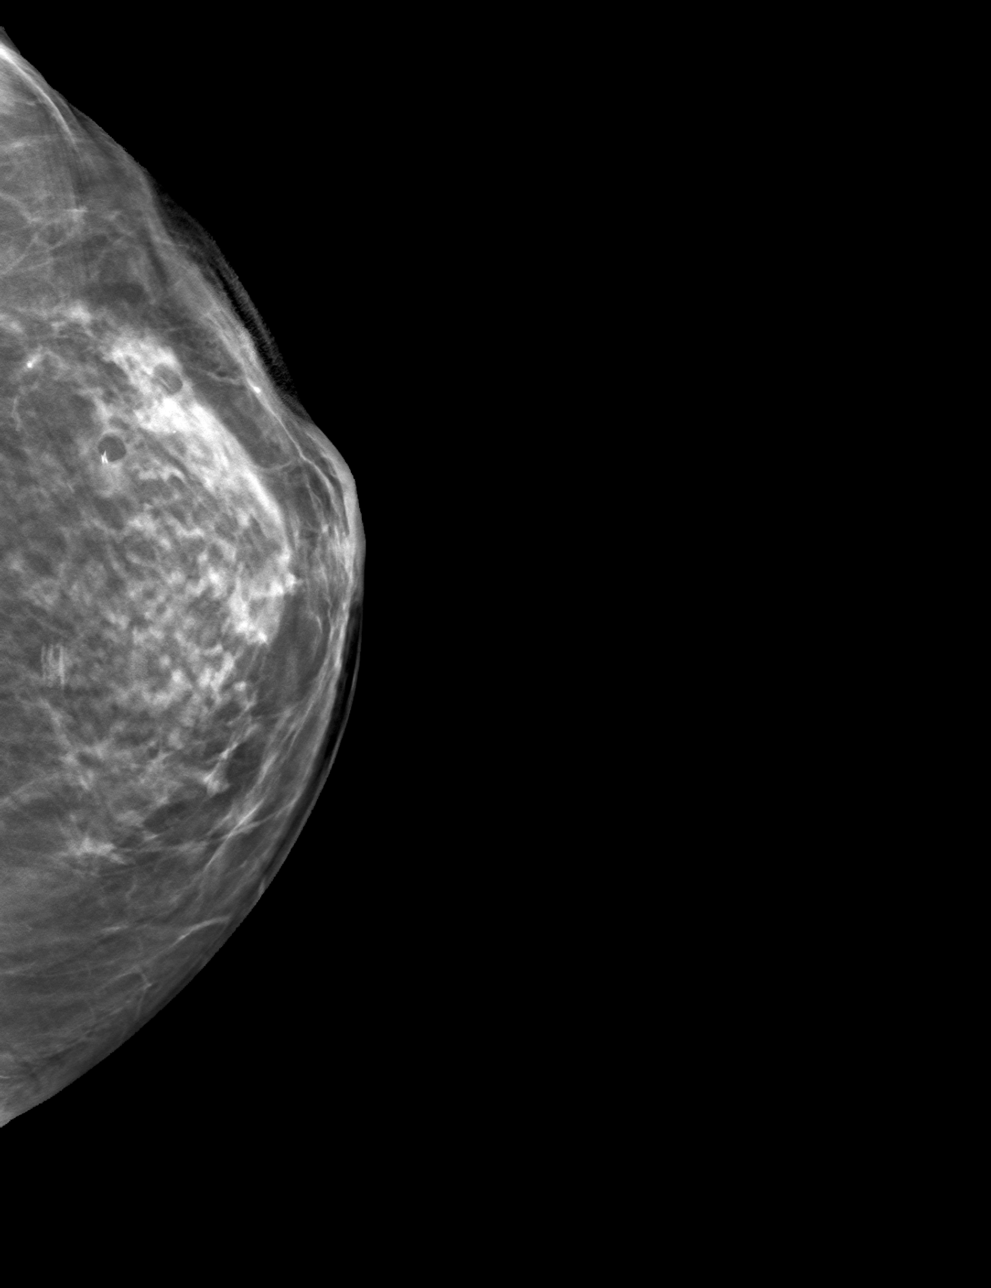

[4 of 12 positions shown; findings below may reference images not displayed]

FINDINGS: 3D Mammographic images were obtained following stereotactic guided
biopsy of left breast calcifications. The biopsy marking clip is in
expected position at the site of biopsy.
IMPRESSION: Appropriate positioning of the X shaped biopsy marking clip at the
site of biopsy in the upper-outer left breast.

Final Assessment: Post Procedure Mammograms for Marker Placement

## 2022-08-10 DIAGNOSIS — N390 Urinary tract infection, site not specified: Secondary | ICD-10-CM | POA: Diagnosis not present

## 2022-08-10 DIAGNOSIS — R350 Frequency of micturition: Secondary | ICD-10-CM | POA: Diagnosis not present

## 2022-08-10 DIAGNOSIS — R81 Glycosuria: Secondary | ICD-10-CM | POA: Diagnosis not present

## 2022-08-12 DIAGNOSIS — R0981 Nasal congestion: Secondary | ICD-10-CM | POA: Diagnosis not present

## 2022-08-12 DIAGNOSIS — Z299 Encounter for prophylactic measures, unspecified: Secondary | ICD-10-CM | POA: Diagnosis not present

## 2022-08-12 DIAGNOSIS — J069 Acute upper respiratory infection, unspecified: Secondary | ICD-10-CM | POA: Diagnosis not present

## 2022-08-29 DIAGNOSIS — Z299 Encounter for prophylactic measures, unspecified: Secondary | ICD-10-CM | POA: Diagnosis not present

## 2022-08-29 DIAGNOSIS — S90859A Superficial foreign body, unspecified foot, initial encounter: Secondary | ICD-10-CM | POA: Diagnosis not present

## 2022-08-29 DIAGNOSIS — I10 Essential (primary) hypertension: Secondary | ICD-10-CM | POA: Diagnosis not present

## 2022-09-12 DIAGNOSIS — C50919 Malignant neoplasm of unspecified site of unspecified female breast: Secondary | ICD-10-CM | POA: Diagnosis not present

## 2022-09-12 DIAGNOSIS — R339 Retention of urine, unspecified: Secondary | ICD-10-CM | POA: Diagnosis not present

## 2022-09-12 DIAGNOSIS — N390 Urinary tract infection, site not specified: Secondary | ICD-10-CM | POA: Diagnosis not present

## 2022-09-20 ENCOUNTER — Ambulatory Visit: Payer: 59 | Admitting: Urology

## 2022-09-20 DIAGNOSIS — R310 Gross hematuria: Secondary | ICD-10-CM

## 2022-09-30 ENCOUNTER — Telehealth: Payer: Self-pay

## 2022-09-30 ENCOUNTER — Other Ambulatory Visit: Payer: Self-pay

## 2022-09-30 MED ORDER — SOLIFENACIN SUCCINATE 5 MG PO TABS: 5.0000 mg | ORAL_TABLET | Freq: Two times a day (BID) | ORAL | 1 refills | Status: DC

## 2022-09-30 NOTE — Telephone Encounter (Signed)
Rx resent with 1 refill.  My chart message sent informing patient pt reminding her to keep scheduled OV for additional refills.

## 2022-09-30 NOTE — Telephone Encounter (Signed)
Patient called and advised they needed a refill on medication below.   Medication: solifenacin (VESICARE) 5 MG tablet    Pharmacy: [facility] - [place], [place] - [address]   Thank you

## 2022-11-01 DIAGNOSIS — I10 Essential (primary) hypertension: Secondary | ICD-10-CM | POA: Diagnosis not present

## 2022-11-01 DIAGNOSIS — Z6831 Body mass index (BMI) 31.0-31.9, adult: Secondary | ICD-10-CM | POA: Diagnosis not present

## 2022-11-01 DIAGNOSIS — Z7189 Other specified counseling: Secondary | ICD-10-CM | POA: Diagnosis not present

## 2022-11-01 DIAGNOSIS — Z79899 Other long term (current) drug therapy: Secondary | ICD-10-CM | POA: Diagnosis not present

## 2022-11-01 DIAGNOSIS — R5383 Other fatigue: Secondary | ICD-10-CM | POA: Diagnosis not present

## 2022-11-01 DIAGNOSIS — Z1339 Encounter for screening examination for other mental health and behavioral disorders: Secondary | ICD-10-CM | POA: Diagnosis not present

## 2022-11-01 DIAGNOSIS — Z87891 Personal history of nicotine dependence: Secondary | ICD-10-CM | POA: Diagnosis not present

## 2022-11-01 DIAGNOSIS — Z0000 Encounter for general adult medical examination without abnormal findings: Secondary | ICD-10-CM | POA: Diagnosis not present

## 2022-11-01 DIAGNOSIS — Z299 Encounter for prophylactic measures, unspecified: Secondary | ICD-10-CM | POA: Diagnosis not present

## 2022-11-01 DIAGNOSIS — Z1331 Encounter for screening for depression: Secondary | ICD-10-CM | POA: Diagnosis not present

## 2022-11-01 DIAGNOSIS — E7800 Pure hypercholesterolemia, unspecified: Secondary | ICD-10-CM | POA: Diagnosis not present

## 2022-11-05 DIAGNOSIS — E7800 Pure hypercholesterolemia, unspecified: Secondary | ICD-10-CM | POA: Diagnosis not present

## 2022-11-05 DIAGNOSIS — R5383 Other fatigue: Secondary | ICD-10-CM | POA: Diagnosis not present

## 2022-11-05 DIAGNOSIS — Z79899 Other long term (current) drug therapy: Secondary | ICD-10-CM | POA: Diagnosis not present

## 2022-11-11 DIAGNOSIS — Z299 Encounter for prophylactic measures, unspecified: Secondary | ICD-10-CM | POA: Diagnosis not present

## 2022-11-11 DIAGNOSIS — R7303 Prediabetes: Secondary | ICD-10-CM | POA: Diagnosis not present

## 2022-11-11 DIAGNOSIS — I10 Essential (primary) hypertension: Secondary | ICD-10-CM | POA: Diagnosis not present

## 2022-11-11 DIAGNOSIS — Z853 Personal history of malignant neoplasm of breast: Secondary | ICD-10-CM | POA: Diagnosis not present

## 2022-11-15 ENCOUNTER — Telehealth: Payer: Self-pay | Admitting: Hematology and Oncology

## 2022-11-15 NOTE — Telephone Encounter (Signed)
Scheduled appointment per scheduling message. Patient is aware of the made appointment. 

## 2022-11-22 ENCOUNTER — Ambulatory Visit (INDEPENDENT_AMBULATORY_CARE_PROVIDER_SITE_OTHER): Payer: PPO | Admitting: Urology

## 2022-11-22 VITALS — BP 138/81 | HR 64 | Ht 66.0 in | Wt 190.0 lb

## 2022-11-22 DIAGNOSIS — R310 Gross hematuria: Secondary | ICD-10-CM | POA: Diagnosis not present

## 2022-11-22 DIAGNOSIS — N3941 Urge incontinence: Secondary | ICD-10-CM

## 2022-11-22 LAB — URINALYSIS, ROUTINE W REFLEX MICROSCOPIC
Bilirubin, UA: NEGATIVE
Glucose, UA: NEGATIVE
Ketones, UA: NEGATIVE
Leukocytes,UA: NEGATIVE
Nitrite, UA: NEGATIVE
Protein,UA: NEGATIVE
Specific Gravity, UA: 1.01 (ref 1.005–1.030)
Urobilinogen, Ur: 0.2 mg/dL (ref 0.2–1.0)
pH, UA: 6 (ref 5.0–7.5)

## 2022-11-22 LAB — MICROSCOPIC EXAMINATION
Bacteria, UA: NONE SEEN
WBC, UA: NONE SEEN /hpf (ref 0–5)

## 2022-11-22 MED ORDER — GEMTESA 75 MG PO TABS: 1.0000 | ORAL_TABLET | Freq: Every day | ORAL | 0 refills | Status: AC

## 2022-11-22 MED ORDER — SOLIFENACIN SUCCINATE 5 MG PO TABS: 5.0000 mg | ORAL_TABLET | Freq: Every day | ORAL | 11 refills | Status: DC

## 2022-11-22 NOTE — Progress Notes (Unsigned)
11/22/2022 11:35 AM   Kaitlyn Santana 06-28-1955 [id number]  Referring provider: Ignatius Specking, MD [address] [place],  [place] [address]  No chief complaint on file.   HPI: Kaitlyn Santana is a 67yo here for followup for OAb and gross hematuria. No hematuria since last visit. She uses 2 pads per day. She has nocturia 2x.   PMH: Past Medical History:  Diagnosis Date   Arthritis    Breast cancer (HCC) 02/2022   left breast IDC   Bronchitis    Cancer of left breast (HCC) 08/2003   GERD (gastroesophageal reflux disease)    HTN (hypertension)    Hyperlipidemia    Irritable bowel disease    Lumbar herniated disc    Other dyspnea and respiratory abnormality    Palpitations    Pre-diabetes    Rectal bleeding 08/10/2018    Surgical History: Past Surgical History:  Procedure Laterality Date   BREAST RECONSTRUCTION Right 04/10/2022   Procedure: Right Breast mastopexy for symmetry;  Surgeon: Peggye Form, DO;  Location: [facility];  Service: Plastics;  Laterality: Right;   BREAST RECONSTRUCTION WITH PLACEMENT OF TISSUE EXPANDER AND FLEX HD (ACELLULAR HYDRATED DERMIS) Left 01/17/2022   Procedure: IMMEDIATE LEFT BREAST RECONSTRUCTION WITH PLACEMENT OF TISSUE EXPANDER AND FLEX HD (ACELLULAR HYDRATED DERMIS);  Surgeon: Peggye Form, DO;  Location: [facility];  Service: Plastics;  Laterality: Left;   BREAST SURGERY     CANCER DIAG 08-2003   COLONOSCOPY WITH PROPOFOL N/A 11/29/2020   Procedure: COLONOSCOPY WITH PROPOFOL;  Surgeon: Malissa Hippo, MD;  [facility];  Service: Endoscopy;  Laterality: N/A;  12:50   LUMBAR LAMINECTOMY/DECOMPRESSION MICRODISCECTOMY Right 10/31/2016   Procedure: Right Lumbar Five-Sacral One Lumbar laminotomy and microdiscectomy;  Surgeon: Shirlean Kelly, MD;  Location: [facility];  Service: Neurosurgery;  Laterality: Right;  Right   MASTECTOMY W/ SENTINEL NODE BIOPSY Left 01/17/2022   Procedure: LEFT TOTAL MASTECTOMY  WITH SENTINEL NODE BIOPSY;  Surgeon: Abigail Miyamoto, MD;  Location: [facility];  Service: General;  Laterality: Left;  LMA   POLYPECTOMY  11/29/2020   Procedure: POLYPECTOMY;  Surgeon: Malissa Hippo, MD;  [facility]: [facility];  Service: Endoscopy;;   PORT-A-CATH REMOVAL Right 04/10/2022   Procedure: REMOVAL PORT-A-CATH;  Surgeon: Peggye Form, DO;  Location: [facility];  Service: Plastics;  Laterality: Right;   PORTACATH PLACEMENT Right 01/17/2022   Procedure: INSERTION PORT-A-CATH;  Surgeon: Abigail Miyamoto, MD;  Location: [facility];  Service: General;  Laterality: Right;   REMOVAL OF TISSUE EXPANDER AND PLACEMENT OF IMPLANT Left 04/10/2022   Procedure: REMOVAL OF LEFT TISSUE EXPANDER AND PLACEMENT OF IMPLANT;  Surgeon: Peggye Form, DO;  Location: [facility];  Service: Plastics;  Laterality: Left;   SHOULDER ARTHROSCOPY W/ LABRAL REPAIR Right    TOE SURGERY     VAGINAL HYSTERECTOMY      Home Medications:  Allergies as of 11/22/2022       Reactions   Bupropion Itching   Ancef [cefazolin Sodium] Hives, Itching   Ciprofloxacin Hives, Itching        Medication List        Accurate as of November 22, 2022 11:35 AM. If you have any questions, ask your nurse or doctor.          calcium carbonate 1250 (500 Ca) MG chewable tablet Commonly known as: OS-CAL Chew 1 tablet by mouth daily.  CENTRUM SILVER 50+WOMEN PO Take 1 tablet by mouth daily.   cyclobenzaprine 10 MG tablet Commonly known as: FLEXERIL Take 10 mg by mouth as needed.   diclofenac 75 MG EC tablet Commonly known as: VOLTAREN Take 75 mg by mouth daily.   magnesium 30 MG tablet Take 30 mg by mouth 2 (two) times daily.   metoprolol succinate 50 MG 24 hr tablet Commonly known as: TOPROL-XL Take 50 mg by mouth daily.   ondansetron 4 MG disintegrating tablet Commonly known as: ZOFRAN-ODT Take 1 tablet (4 mg total) by mouth every 8  (eight) hours as needed for nausea or vomiting.   pantoprazole 40 MG tablet Commonly known as: PROTONIX Take 40 mg by mouth daily.   rOPINIRole 0.5 MG tablet Commonly known as: REQUIP Take 0.5 mg by mouth at bedtime.   rosuvastatin 10 MG tablet Commonly known as: CRESTOR Take 10 mg by mouth daily.   solifenacin 5 MG tablet Commonly known as: VESICARE Take 1 tablet (5 mg total) by mouth 2 (two) times daily.   vitamin B-12 100 MCG tablet Commonly known as: CYANOCOBALAMIN Take 100 mcg by mouth daily.        Allergies:  Allergies  Allergen Reactions   Bupropion Itching   Ancef [Cefazolin Sodium] Hives and Itching   Ciprofloxacin Hives and Itching    Family History: Family History  Problem Relation Age of Onset   Anxiety disorder Mother    Depression Brother    Stroke Maternal Grandfather    Breast cancer Paternal Grandmother     Social History:  reports that she quit smoking about 14 years ago. Her smoking use included cigarettes. She has a 35.00 pack-year smoking history. She has never used smokeless tobacco. She reports current alcohol use. She reports that she does not use drugs.  ROS: All other review of systems were reviewed and are negative except what is noted above in HPI  Physical Exam: BP 138/81   Pulse 64   Ht 5\' 6"  (1.676 m)   Wt 190 lb (86.2 kg)   BMI 30.67 kg/m   Constitutional:  Alert and oriented, No acute distress. HEENT: [place] AT, moist mucus membranes.  Trachea midline, no masses. Cardiovascular: No clubbing, cyanosis, or edema. Respiratory: Normal respiratory effort, no increased work of breathing. GI: Abdomen is soft, nontender, nondistended, no abdominal masses GU: No CVA tenderness.  Lymph: No cervical or inguinal lymphadenopathy. Skin: No rashes, bruises or suspicious lesions. Neurologic: Grossly intact, no focal deficits, moving all 4 extremities. Psychiatric: Normal mood and affect.  Laboratory Data: Lab Results  Component Value  Date   WBC 9.2 10/03/2020   HGB 13.3 10/03/2020   HCT 41.8 10/03/2020   MCV 92.1 10/03/2020   PLT 266 10/03/2020    Lab Results  Component Value Date   CREATININE 0.49 10/03/2020    No results found for: "PSA"  No results found for: "TESTOSTERONE"  No results found for: "HGBA1C"  Urinalysis    Component Value Date/Time   APPEARANCEUR Clear 08/27/2021 1148   GLUCOSEU Negative 08/27/2021 1148   BILIRUBINUR Negative 08/27/2021 1148   PROTEINUR Negative 08/27/2021 1148   UROBILINOGEN 0.2 06/14/2020 1333   NITRITE Negative 08/27/2021 1148   LEUKOCYTESUR Negative 08/27/2021 1148    Lab Results  Component Value Date   LABMICR See below: 08/27/2021   WBCUA 0-5 08/27/2021   LABEPIT 0-10 08/27/2021   BACTERIA Few 08/27/2021    Pertinent Imaging: *** No results found for this or any previous visit.  No results found for this or any previous visit.  No results found for this or any previous visit.  No results found for this or any previous visit.  No results found for this or any previous visit.  No valid procedures specified. No results found for this or any previous visit.  No results found for this or any previous visit.   Assessment & Plan:    1. Gross hematuria -resolved - Urinalysis, Routine w reflex microscopic  2. Urge incontinence Gemtesa in AM Vesicar 5mg  pm   No follow-ups on file.  Wilkie Aye, MD  [facility] [place]

## 2022-11-25 ENCOUNTER — Other Ambulatory Visit: Payer: Self-pay

## 2022-11-25 ENCOUNTER — Inpatient Hospital Stay: Payer: PPO | Attending: Adult Health | Admitting: Adult Health

## 2022-11-25 ENCOUNTER — Encounter: Payer: Self-pay | Admitting: Adult Health

## 2022-11-25 ENCOUNTER — Telehealth: Payer: Self-pay

## 2022-11-25 VITALS — BP 130/81 | HR 90 | Temp 97.9°F | Resp 16 | Wt 193.2 lb

## 2022-11-25 DIAGNOSIS — Z1231 Encounter for screening mammogram for malignant neoplasm of breast: Secondary | ICD-10-CM

## 2022-11-25 DIAGNOSIS — Z87891 Personal history of nicotine dependence: Secondary | ICD-10-CM | POA: Diagnosis not present

## 2022-11-25 DIAGNOSIS — Z803 Family history of malignant neoplasm of breast: Secondary | ICD-10-CM | POA: Insufficient documentation

## 2022-11-25 DIAGNOSIS — Z9012 Acquired absence of left breast and nipple: Secondary | ICD-10-CM | POA: Diagnosis not present

## 2022-11-25 DIAGNOSIS — Z171 Estrogen receptor negative status [ER-]: Secondary | ICD-10-CM | POA: Insufficient documentation

## 2022-11-25 DIAGNOSIS — C50412 Malignant neoplasm of upper-outer quadrant of left female breast: Secondary | ICD-10-CM | POA: Diagnosis not present

## 2022-11-25 NOTE — Progress Notes (Signed)
[facility] Cancer Follow up:    Kaitlyn Specking, MD [address] [place] [address]   DIAGNOSIS:  Cancer Staging  Malignant neoplasm of upper-outer quadrant of left breast in female, estrogen receptor negative (HCC) Staging form: Breast, AJCC 8th Edition - Clinical: Stage IA (cT1a, cN0, cM0, G2, ER-, PR-, HER2+) - Signed by Loa Socks, NP on 11/25/2022 Histologic grading system: 3 grade system   SUMMARY OF ONCOLOGIC HISTORY: Oncology History  Malignant neoplasm of upper-outer quadrant of left breast in female, estrogen receptor negative (HCC)  12/13/2021 Initial Diagnosis   Left breast biopsy UOQ: Grade 2 IDC with apocrine features, with high-grade DCIS, ER 0%, PR 0%, HER2 3+ positive, Ki-67 5%   01/17/2022 Surgery   Left mastectomy: Grade 2 apocrine DCIS without any residual invasive component, clear margins, 0/3 lymph nodes negative, ER 0%, PR 0%, HER2 3+ positive, Ki-67 5%   11/25/2022 Cancer Staging   Staging form: Breast, AJCC 8th Edition - Clinical: Stage IA (cT1a, cN0, cM0, G2, ER-, PR-, HER2+) - Signed by Loa Socks, NP on 11/25/2022 Histologic grading system: 3 grade system     CURRENT THERAPY: observation  INTERVAL HISTORY: Kaitlyn Santana 67 y.o. female returns for f/u of her history of left breast cancer.  She is doing well today.  She continues on observation alone with good tolerance.  She is status post right breast reduction and tells me she still has some residual scar tissue and has not yet undergone a mammogram because of the discomfort she feels intermittently in her breast.  Sh  She is seeing her PCP regularly.  She tells me that she has not exercised recently because her gym partner is out with shingles.  She is unhappy with her breast reconstruction and wants to know if she can get a second opinion elsewhere.     Patient Active Problem List   Diagnosis Date Noted   Breast asymmetry following reconstructive surgery  07/02/2022   Malignant neoplasm of upper-outer quadrant of left breast in female, estrogen receptor negative (HCC) 12/13/2021   Urge incontinence 06/14/2020   Gross hematuria 06/14/2020   Rectal bleeding 08/10/2018   HNP (herniated nucleus pulposus), lumbar 10/31/2016   Depression, major, recurrent (HCC) 02/17/2013   IBS (irritable bowel syndrome) 03/10/2012   Diarrhea 03/10/2012   Abnormal echocardiogram 10/30/2010   Palpitations    HTN (hypertension)     is allergic to bupropion, ancef [cefazolin sodium], and ciprofloxacin.  MEDICAL HISTORY: Past Medical History:  Diagnosis Date   Arthritis    Breast cancer (HCC) 02/2022   left breast IDC   Bronchitis    Cancer of left breast (HCC) 08/2003   GERD (gastroesophageal reflux disease)    HTN (hypertension)    Hyperlipidemia    Irritable bowel disease    Lumbar herniated disc    Other dyspnea and respiratory abnormality    Palpitations    Pre-diabetes    Rectal bleeding 08/10/2018    SURGICAL HISTORY: Past Surgical History:  Procedure Laterality Date   BREAST RECONSTRUCTION Right 04/10/2022   Procedure: Right Breast mastopexy for symmetry;  Surgeon: Peggye Form, DO;  Location: [facility];  Service: Plastics;  Laterality: Right;   BREAST RECONSTRUCTION WITH PLACEMENT OF TISSUE EXPANDER AND FLEX HD (ACELLULAR HYDRATED DERMIS) Left 01/17/2022   Procedure: IMMEDIATE LEFT BREAST RECONSTRUCTION WITH PLACEMENT OF TISSUE EXPANDER AND FLEX HD (ACELLULAR HYDRATED DERMIS);  Surgeon: Peggye Form, DO;  Location: [facility];  Service: Government social research officer;  Laterality: Left;   BREAST SURGERY     CANCER DIAG 08-2003   COLONOSCOPY WITH PROPOFOL N/A 11/29/2020   Procedure: COLONOSCOPY WITH PROPOFOL;  Surgeon: Malissa Hippo, MD;  [facility];  Service: Endoscopy;  Laterality: N/A;  12:50   LUMBAR LAMINECTOMY/DECOMPRESSION MICRODISCECTOMY Right 10/31/2016   Procedure: Right Lumbar Five-Sacral  One Lumbar laminotomy and microdiscectomy;  Surgeon: Shirlean Kelly, MD;  Location: [facility];  Service: Neurosurgery;  Laterality: Right;  Right   MASTECTOMY W/ SENTINEL NODE BIOPSY Left 01/17/2022   Procedure: LEFT TOTAL MASTECTOMY WITH SENTINEL NODE BIOPSY;  Surgeon: Abigail Miyamoto, MD;  Location: [facility];  Service: General;  Laterality: Left;  LMA   POLYPECTOMY  11/29/2020   Procedure: POLYPECTOMY;  Surgeon: Malissa Hippo, MD;  [facility];  Service: Endoscopy;;   PORT-A-CATH REMOVAL Right 04/10/2022   Procedure: REMOVAL PORT-A-CATH;  Surgeon: Peggye Form, DO;  Location: [facility];  Service: Plastics;  Laterality: Right;   PORTACATH PLACEMENT Right 01/17/2022   Procedure: INSERTION PORT-A-CATH;  Surgeon: Abigail Miyamoto, MD;  Location: [facility];  Service: General;  Laterality: Right;   REMOVAL OF TISSUE EXPANDER AND PLACEMENT OF IMPLANT Left 04/10/2022   Procedure: REMOVAL OF LEFT TISSUE EXPANDER AND PLACEMENT OF IMPLANT;  Surgeon: Peggye Form, DO;  Location:  SURGERY CENTER;  Service: Plastics;  Laterality: Left;   SHOULDER ARTHROSCOPY W/ LABRAL REPAIR Right    TOE SURGERY     VAGINAL HYSTERECTOMY      SOCIAL HISTORY: Social History   Socioeconomic History   Marital status: Married    Spouse name: RONALD   Number of children: 1   Years of education: Not on file   Highest education level: Not on file  Occupational History   Occupation: [employer]    Employer: [employer]  Tobacco Use   Smoking status: Former    Packs/day: 1.00    Years: 35.00    Additional pack years: 0.00    Total pack years: 35.00    Types: Cigarettes    Quit date: 05/10/2008    Years since quitting: 14.5   Smokeless tobacco: Never   Tobacco comments:    Quit in 2009  Vaping Use   Vaping Use: Never used  Substance and Sexual Activity   Alcohol use: Yes    Comment: occasional   Drug use: No    Sexual activity: Yes    Birth control/protection: Surgical    Comment: hyst  Other Topics Concern   Not on file  Social History Narrative   Not on file   Social Determinants of Health   Financial Resource Strain: Medium Risk (12/25/2021)   Overall Financial Resource Strain (CARDIA)    Difficulty of Paying Living Expenses: Somewhat hard  Food Insecurity: No Food Insecurity (12/25/2021)   Hunger Vital Sign    Worried About Running Out of Food in the Last Year: Never true    Ran Out of Food in the Last Year: Never true  Transportation Needs: Not on file  Physical Activity: Not on file  Stress: Not on file  Social Connections: Not on file  Intimate Partner Violence: Not on file    FAMILY HISTORY: Family History  Problem Relation Age of Onset   Anxiety disorder Mother    Depression Brother    Stroke Maternal Grandfather    Breast cancer Paternal Grandmother     Review of Systems  Constitutional:  Negative  for appetite change, chills, fatigue, fever and unexpected weight change.  HENT:   Negative for hearing loss, lump/mass and trouble swallowing.   Eyes:  Negative for eye problems and icterus.  Respiratory:  Negative for chest tightness, cough and shortness of breath.   Cardiovascular:  Negative for chest pain, leg swelling and palpitations.  Gastrointestinal:  Negative for abdominal distention, abdominal pain, constipation, diarrhea, nausea and vomiting.  Endocrine: Negative for hot flashes.  Genitourinary:  Negative for difficulty urinating.   Musculoskeletal:  Negative for arthralgias.  Skin:  Negative for itching and rash.  Neurological:  Negative for dizziness, extremity weakness, headaches and numbness.  Hematological:  Negative for adenopathy. Does not bruise/bleed easily.  Psychiatric/Behavioral:  Negative for depression. The patient is not nervous/anxious.       PHYSICAL EXAMINATION    Vitals:   11/25/22 1417  BP: 130/81  Pulse: 90  Resp: 16  Temp: 97.9 F  (36.6 C)  SpO2: 93%    Physical Exam Constitutional:      General: She is not in acute distress.    Appearance: Normal appearance. She is not toxic-appearing.  HENT:     Head: Normocephalic and atraumatic.     Mouth/Throat:     Mouth: Mucous membranes are moist.     Pharynx: Oropharynx is clear. No oropharyngeal exudate or posterior oropharyngeal erythema.  Eyes:     General: No scleral icterus. Cardiovascular:     Rate and Rhythm: Normal rate and regular rhythm.     Pulses: Normal pulses.     Heart sounds: Normal heart sounds.  Pulmonary:     Effort: Pulmonary effort is normal.     Breath sounds: Normal breath sounds.  Chest:     Comments: Left breast status postmastectomy, right breast status post reduction, bilateral breast exam is benign.  No sign of breast cancer recurrence. Abdominal:     General: Abdomen is flat. Bowel sounds are normal. There is no distension.     Palpations: Abdomen is soft.     Tenderness: There is no abdominal tenderness.  Musculoskeletal:        General: No swelling.     Cervical back: Neck supple.  Lymphadenopathy:     Cervical: No cervical adenopathy.  Skin:    General: Skin is warm and dry.     Findings: No rash.  Neurological:     General: No focal deficit present.     Mental Status: She is alert.  Psychiatric:        Mood and Affect: Mood normal.        Behavior: Behavior normal.        ASSESSMENT and THERAPY PLAN:   Malignant neoplasm of upper-outer quadrant of left breast in female, estrogen receptor negative (HCC) Kaitlyn Santana is a 67 year old woman with left breast stage Ia HER2 positive invasive ductal carcinoma status post left mastectomy, not requiring radiation, chemotherapy, or immunotherapy.  Kaitlyn Santana has no clinical or radiographic signs of breast cancer recurrence.  I placed orders for her to undergo a right breast screening mammogram at the right Center in [place].  Right breast tenderness: I recommended small massages with  vitamin E oil that can help with the scar tissue and the discomfort.  We discussed her reconstruction issues.  I placed a referral for her to see Dr. Leta Baptist to discuss if there are any other options available to her.  I recommended continued healthy diet and exercise.  RTC in 1 year for continued observation.    All  questions were answered. The patient knows to call the clinic with any problems, questions or concerns. We can certainly see the patient much sooner if necessary.  Total encounter time:30 minutes*in face-to-face visit time, chart review, lab review, care coordination, order entry, and documentation of the encounter time.  Lillard Anes, NP 11/25/22 3:02 PM Medical Oncology and Hematology [facility] [address] [place], [place] [address] Tel. [phone]    Fax. [phone]  *Total Encounter Time as defined by the Centers for Medicare and Medicaid Services includes, in addition to the face-to-face time of a patient visit (documented in the note above) non-face-to-face time: obtaining and reviewing outside history, ordering and reviewing medications, tests or procedures, care coordination (communications with other health care professionals or caregivers) and documentation in the medical record.

## 2022-11-25 NOTE — Telephone Encounter (Signed)
please make sure thimmappa gets referral (it is external) for plastics on this patient   Referral faxed to Dr Glenna Fellows and confirmation received

## 2022-11-25 NOTE — Assessment & Plan Note (Signed)
Adrieana is a 67 year old woman with left breast stage Ia HER2 positive invasive ductal carcinoma status post left mastectomy, not requiring radiation, chemotherapy, or immunotherapy.  Angela has no clinical or radiographic signs of breast cancer recurrence.  I placed orders for her to undergo a right breast screening mammogram at the right Center in [place].  Right breast tenderness: I recommended small massages with vitamin E oil that can help with the scar tissue and the discomfort.  We discussed her reconstruction issues.  I placed a referral for her to see Dr. Leta Baptist to discuss if there are any other options available to her.  I recommended continued healthy diet and exercise.  RTC in 1 year for continued observation.

## 2022-11-26 ENCOUNTER — Telehealth: Payer: Self-pay

## 2022-11-26 ENCOUNTER — Encounter: Payer: Self-pay | Admitting: Urology

## 2022-11-26 NOTE — Patient Instructions (Signed)

## 2022-11-26 NOTE — Telephone Encounter (Signed)
Order for mammogram faxed to [facility] in [place] [phone]  Confirmation received

## 2022-11-28 ENCOUNTER — Telehealth: Payer: Self-pay | Admitting: Adult Health

## 2022-11-28 NOTE — Telephone Encounter (Signed)
Scheduled appointment per 6/17 los. Patient is aware of the made appointment. 

## 2022-11-29 ENCOUNTER — Other Ambulatory Visit: Payer: Self-pay

## 2022-12-02 DIAGNOSIS — Z1231 Encounter for screening mammogram for malignant neoplasm of breast: Secondary | ICD-10-CM | POA: Diagnosis not present

## 2022-12-03 DIAGNOSIS — Z299 Encounter for prophylactic measures, unspecified: Secondary | ICD-10-CM | POA: Diagnosis not present

## 2022-12-03 DIAGNOSIS — I10 Essential (primary) hypertension: Secondary | ICD-10-CM | POA: Diagnosis not present

## 2022-12-03 DIAGNOSIS — D492 Neoplasm of unspecified behavior of bone, soft tissue, and skin: Secondary | ICD-10-CM | POA: Diagnosis not present

## 2022-12-30 ENCOUNTER — Other Ambulatory Visit: Payer: Self-pay | Admitting: Urology

## 2022-12-30 ENCOUNTER — Telehealth: Payer: Self-pay

## 2022-12-30 DIAGNOSIS — N3941 Urge incontinence: Secondary | ICD-10-CM

## 2022-12-30 NOTE — Telephone Encounter (Signed)
Return call to patient. Patient states that she was having painful leg cramps with the Gentesa and would like to go back to vesicare. Patient states that she in the pharmacy getting her vesicare. Patient is aware Dr. Ronne Binning will be informed. Patient voiced understanding.

## 2022-12-30 NOTE — Telephone Encounter (Signed)
Tried returning vm to patient with no answer. Left vm for return call

## 2022-12-31 NOTE — Telephone Encounter (Signed)
Left detailed vm making patient aware of Rx sent to pharmacy per Dr. Ronne Binning.

## 2023-01-02 DIAGNOSIS — M549 Dorsalgia, unspecified: Secondary | ICD-10-CM | POA: Diagnosis not present

## 2023-01-02 DIAGNOSIS — R52 Pain, unspecified: Secondary | ICD-10-CM | POA: Diagnosis not present

## 2023-01-02 DIAGNOSIS — C50919 Malignant neoplasm of unspecified site of unspecified female breast: Secondary | ICD-10-CM | POA: Diagnosis not present

## 2023-01-02 DIAGNOSIS — Z9889 Other specified postprocedural states: Secondary | ICD-10-CM | POA: Diagnosis not present

## 2023-01-02 DIAGNOSIS — Z923 Personal history of irradiation: Secondary | ICD-10-CM | POA: Diagnosis not present

## 2023-01-02 DIAGNOSIS — Z299 Encounter for prophylactic measures, unspecified: Secondary | ICD-10-CM | POA: Diagnosis not present

## 2023-01-02 DIAGNOSIS — Z9012 Acquired absence of left breast and nipple: Secondary | ICD-10-CM | POA: Diagnosis not present

## 2023-01-02 DIAGNOSIS — Z853 Personal history of malignant neoplasm of breast: Secondary | ICD-10-CM | POA: Diagnosis not present

## 2023-01-02 DIAGNOSIS — I10 Essential (primary) hypertension: Secondary | ICD-10-CM | POA: Diagnosis not present

## 2023-01-27 DIAGNOSIS — R252 Cramp and spasm: Secondary | ICD-10-CM | POA: Diagnosis not present

## 2023-01-27 DIAGNOSIS — Z299 Encounter for prophylactic measures, unspecified: Secondary | ICD-10-CM | POA: Diagnosis not present

## 2023-01-27 DIAGNOSIS — C50919 Malignant neoplasm of unspecified site of unspecified female breast: Secondary | ICD-10-CM | POA: Diagnosis not present

## 2023-01-27 DIAGNOSIS — Z79899 Other long term (current) drug therapy: Secondary | ICD-10-CM | POA: Diagnosis not present

## 2023-01-27 DIAGNOSIS — G2581 Restless legs syndrome: Secondary | ICD-10-CM | POA: Diagnosis not present

## 2023-01-27 DIAGNOSIS — I10 Essential (primary) hypertension: Secondary | ICD-10-CM | POA: Diagnosis not present

## 2023-01-31 ENCOUNTER — Telehealth: Payer: Self-pay

## 2023-01-31 NOTE — Telephone Encounter (Signed)
Tried calling patient with no answer, left vm for return call 

## 2023-02-12 DIAGNOSIS — Z853 Personal history of malignant neoplasm of breast: Secondary | ICD-10-CM | POA: Diagnosis not present

## 2023-03-31 DIAGNOSIS — I10 Essential (primary) hypertension: Secondary | ICD-10-CM | POA: Diagnosis not present

## 2023-03-31 DIAGNOSIS — Z299 Encounter for prophylactic measures, unspecified: Secondary | ICD-10-CM | POA: Diagnosis not present

## 2023-03-31 DIAGNOSIS — F322 Major depressive disorder, single episode, severe without psychotic features: Secondary | ICD-10-CM | POA: Diagnosis not present

## 2023-03-31 DIAGNOSIS — R350 Frequency of micturition: Secondary | ICD-10-CM | POA: Diagnosis not present

## 2023-03-31 DIAGNOSIS — R32 Unspecified urinary incontinence: Secondary | ICD-10-CM | POA: Diagnosis not present

## 2023-03-31 DIAGNOSIS — N390 Urinary tract infection, site not specified: Secondary | ICD-10-CM | POA: Diagnosis not present

## 2023-04-17 DIAGNOSIS — Z299 Encounter for prophylactic measures, unspecified: Secondary | ICD-10-CM | POA: Diagnosis not present

## 2023-04-17 DIAGNOSIS — R7303 Prediabetes: Secondary | ICD-10-CM | POA: Diagnosis not present

## 2023-04-17 DIAGNOSIS — I10 Essential (primary) hypertension: Secondary | ICD-10-CM | POA: Diagnosis not present

## 2023-04-18 DIAGNOSIS — I77810 Thoracic aortic ectasia: Secondary | ICD-10-CM | POA: Diagnosis not present

## 2023-04-18 DIAGNOSIS — Z853 Personal history of malignant neoplasm of breast: Secondary | ICD-10-CM | POA: Diagnosis not present

## 2023-04-18 DIAGNOSIS — Z01818 Encounter for other preprocedural examination: Secondary | ICD-10-CM | POA: Diagnosis not present

## 2023-05-14 DIAGNOSIS — Z299 Encounter for prophylactic measures, unspecified: Secondary | ICD-10-CM | POA: Diagnosis not present

## 2023-05-14 DIAGNOSIS — I10 Essential (primary) hypertension: Secondary | ICD-10-CM | POA: Diagnosis not present

## 2023-05-14 DIAGNOSIS — F322 Major depressive disorder, single episode, severe without psychotic features: Secondary | ICD-10-CM | POA: Diagnosis not present

## 2023-05-20 DIAGNOSIS — L03031 Cellulitis of right toe: Secondary | ICD-10-CM | POA: Diagnosis not present

## 2023-05-20 DIAGNOSIS — I10 Essential (primary) hypertension: Secondary | ICD-10-CM | POA: Diagnosis not present

## 2023-05-20 DIAGNOSIS — Z299 Encounter for prophylactic measures, unspecified: Secondary | ICD-10-CM | POA: Diagnosis not present

## 2023-05-20 DIAGNOSIS — B351 Tinea unguium: Secondary | ICD-10-CM | POA: Diagnosis not present

## 2023-05-21 DIAGNOSIS — Z9012 Acquired absence of left breast and nipple: Secondary | ICD-10-CM | POA: Diagnosis not present

## 2023-05-21 DIAGNOSIS — N651 Disproportion of reconstructed breast: Secondary | ICD-10-CM | POA: Diagnosis not present

## 2023-05-21 DIAGNOSIS — Z853 Personal history of malignant neoplasm of breast: Secondary | ICD-10-CM | POA: Diagnosis not present

## 2023-05-21 DIAGNOSIS — T8544XA Capsular contracture of breast implant, initial encounter: Secondary | ICD-10-CM | POA: Diagnosis not present

## 2023-07-04 ENCOUNTER — Ambulatory Visit: Payer: PPO | Admitting: Plastic Surgery

## 2023-08-04 DIAGNOSIS — R0981 Nasal congestion: Secondary | ICD-10-CM | POA: Diagnosis not present

## 2023-08-04 DIAGNOSIS — J069 Acute upper respiratory infection, unspecified: Secondary | ICD-10-CM | POA: Diagnosis not present

## 2023-08-04 DIAGNOSIS — Z299 Encounter for prophylactic measures, unspecified: Secondary | ICD-10-CM | POA: Diagnosis not present

## 2023-08-14 DIAGNOSIS — I10 Essential (primary) hypertension: Secondary | ICD-10-CM | POA: Diagnosis not present

## 2023-08-14 DIAGNOSIS — E785 Hyperlipidemia, unspecified: Secondary | ICD-10-CM | POA: Diagnosis not present

## 2023-08-14 DIAGNOSIS — F322 Major depressive disorder, single episode, severe without psychotic features: Secondary | ICD-10-CM | POA: Diagnosis not present

## 2023-08-14 DIAGNOSIS — Z299 Encounter for prophylactic measures, unspecified: Secondary | ICD-10-CM | POA: Diagnosis not present

## 2023-08-28 DIAGNOSIS — Z299 Encounter for prophylactic measures, unspecified: Secondary | ICD-10-CM | POA: Diagnosis not present

## 2023-08-28 DIAGNOSIS — B029 Zoster without complications: Secondary | ICD-10-CM | POA: Diagnosis not present

## 2023-08-28 DIAGNOSIS — N63 Unspecified lump in unspecified breast: Secondary | ICD-10-CM | POA: Diagnosis not present

## 2023-08-28 DIAGNOSIS — I10 Essential (primary) hypertension: Secondary | ICD-10-CM | POA: Diagnosis not present

## 2023-10-30 DIAGNOSIS — Z299 Encounter for prophylactic measures, unspecified: Secondary | ICD-10-CM | POA: Diagnosis not present

## 2023-10-30 DIAGNOSIS — H2513 Age-related nuclear cataract, bilateral: Secondary | ICD-10-CM | POA: Diagnosis not present

## 2023-10-30 DIAGNOSIS — I10 Essential (primary) hypertension: Secondary | ICD-10-CM | POA: Diagnosis not present

## 2023-10-30 DIAGNOSIS — H40033 Anatomical narrow angle, bilateral: Secondary | ICD-10-CM | POA: Diagnosis not present

## 2023-10-30 DIAGNOSIS — R7303 Prediabetes: Secondary | ICD-10-CM | POA: Diagnosis not present

## 2023-11-05 DIAGNOSIS — H2512 Age-related nuclear cataract, left eye: Secondary | ICD-10-CM | POA: Diagnosis not present

## 2023-11-05 DIAGNOSIS — H2513 Age-related nuclear cataract, bilateral: Secondary | ICD-10-CM | POA: Diagnosis not present

## 2023-11-05 DIAGNOSIS — Z01818 Encounter for other preprocedural examination: Secondary | ICD-10-CM | POA: Diagnosis not present

## 2023-11-13 DIAGNOSIS — R5383 Other fatigue: Secondary | ICD-10-CM | POA: Diagnosis not present

## 2023-11-13 DIAGNOSIS — Z1339 Encounter for screening examination for other mental health and behavioral disorders: Secondary | ICD-10-CM | POA: Diagnosis not present

## 2023-11-13 DIAGNOSIS — Z6831 Body mass index (BMI) 31.0-31.9, adult: Secondary | ICD-10-CM | POA: Diagnosis not present

## 2023-11-13 DIAGNOSIS — Z1331 Encounter for screening for depression: Secondary | ICD-10-CM | POA: Diagnosis not present

## 2023-11-13 DIAGNOSIS — I10 Essential (primary) hypertension: Secondary | ICD-10-CM | POA: Diagnosis not present

## 2023-11-13 DIAGNOSIS — Z79899 Other long term (current) drug therapy: Secondary | ICD-10-CM | POA: Diagnosis not present

## 2023-11-13 DIAGNOSIS — Z87891 Personal history of nicotine dependence: Secondary | ICD-10-CM | POA: Diagnosis not present

## 2023-11-13 DIAGNOSIS — Z7189 Other specified counseling: Secondary | ICD-10-CM | POA: Diagnosis not present

## 2023-11-13 DIAGNOSIS — F419 Anxiety disorder, unspecified: Secondary | ICD-10-CM | POA: Diagnosis not present

## 2023-11-13 DIAGNOSIS — Z0000 Encounter for general adult medical examination without abnormal findings: Secondary | ICD-10-CM | POA: Diagnosis not present

## 2023-11-13 DIAGNOSIS — E7800 Pure hypercholesterolemia, unspecified: Secondary | ICD-10-CM | POA: Diagnosis not present

## 2023-11-13 DIAGNOSIS — Z299 Encounter for prophylactic measures, unspecified: Secondary | ICD-10-CM | POA: Diagnosis not present

## 2023-11-14 DIAGNOSIS — F419 Anxiety disorder, unspecified: Secondary | ICD-10-CM | POA: Diagnosis not present

## 2023-11-14 DIAGNOSIS — Z79899 Other long term (current) drug therapy: Secondary | ICD-10-CM | POA: Diagnosis not present

## 2023-11-14 DIAGNOSIS — R5383 Other fatigue: Secondary | ICD-10-CM | POA: Diagnosis not present

## 2023-11-14 DIAGNOSIS — E7800 Pure hypercholesterolemia, unspecified: Secondary | ICD-10-CM | POA: Diagnosis not present

## 2023-11-21 ENCOUNTER — Ambulatory Visit: Payer: PPO | Admitting: Urology

## 2023-11-21 DIAGNOSIS — Z87891 Personal history of nicotine dependence: Secondary | ICD-10-CM | POA: Diagnosis not present

## 2023-11-21 DIAGNOSIS — H2512 Age-related nuclear cataract, left eye: Secondary | ICD-10-CM | POA: Diagnosis not present

## 2023-11-21 DIAGNOSIS — I10 Essential (primary) hypertension: Secondary | ICD-10-CM | POA: Diagnosis not present

## 2023-11-25 ENCOUNTER — Inpatient Hospital Stay: Payer: PPO | Admitting: Adult Health

## 2023-12-05 DIAGNOSIS — H2511 Age-related nuclear cataract, right eye: Secondary | ICD-10-CM | POA: Diagnosis not present

## 2023-12-05 DIAGNOSIS — I10 Essential (primary) hypertension: Secondary | ICD-10-CM | POA: Diagnosis not present

## 2023-12-11 DIAGNOSIS — H43393 Other vitreous opacities, bilateral: Secondary | ICD-10-CM | POA: Diagnosis not present

## 2023-12-16 ENCOUNTER — Other Ambulatory Visit: Payer: Self-pay | Admitting: Urology

## 2023-12-16 DIAGNOSIS — N3941 Urge incontinence: Secondary | ICD-10-CM

## 2023-12-18 DIAGNOSIS — Z1231 Encounter for screening mammogram for malignant neoplasm of breast: Secondary | ICD-10-CM | POA: Diagnosis not present

## 2024-01-02 DIAGNOSIS — I10 Essential (primary) hypertension: Secondary | ICD-10-CM | POA: Diagnosis not present

## 2024-01-02 DIAGNOSIS — M71332 Other bursal cyst, left wrist: Secondary | ICD-10-CM | POA: Diagnosis not present

## 2024-01-02 DIAGNOSIS — Z299 Encounter for prophylactic measures, unspecified: Secondary | ICD-10-CM | POA: Diagnosis not present

## 2024-01-20 DIAGNOSIS — L820 Inflamed seborrheic keratosis: Secondary | ICD-10-CM | POA: Diagnosis not present

## 2024-01-26 DIAGNOSIS — J45909 Unspecified asthma, uncomplicated: Secondary | ICD-10-CM | POA: Diagnosis not present

## 2024-01-26 DIAGNOSIS — Z87891 Personal history of nicotine dependence: Secondary | ICD-10-CM | POA: Diagnosis not present

## 2024-01-26 DIAGNOSIS — Z299 Encounter for prophylactic measures, unspecified: Secondary | ICD-10-CM | POA: Diagnosis not present

## 2024-01-26 DIAGNOSIS — Z0000 Encounter for general adult medical examination without abnormal findings: Secondary | ICD-10-CM | POA: Diagnosis not present

## 2024-01-26 DIAGNOSIS — E8940 Asymptomatic postprocedural ovarian failure: Secondary | ICD-10-CM | POA: Diagnosis not present

## 2024-01-26 DIAGNOSIS — I10 Essential (primary) hypertension: Secondary | ICD-10-CM | POA: Diagnosis not present

## 2024-03-01 ENCOUNTER — Ambulatory Visit: Admitting: Urology

## 2024-03-01 ENCOUNTER — Encounter: Payer: Self-pay | Admitting: Urology

## 2024-03-01 VITALS — BP 101/65 | HR 69

## 2024-03-01 DIAGNOSIS — N3941 Urge incontinence: Secondary | ICD-10-CM

## 2024-03-01 DIAGNOSIS — R310 Gross hematuria: Secondary | ICD-10-CM

## 2024-03-01 DIAGNOSIS — Z87448 Personal history of other diseases of urinary system: Secondary | ICD-10-CM | POA: Diagnosis not present

## 2024-03-01 MED ORDER — SOLIFENACIN SUCCINATE 5 MG PO TABS: 5.0000 mg | ORAL_TABLET | Freq: Two times a day (BID) | ORAL | 11 refills | Status: AC

## 2024-03-01 NOTE — Patient Instructions (Signed)

## 2024-03-01 NOTE — Progress Notes (Signed)
 03/01/2024 1:32 PM   Kaitlyn Santana [birth year] [id number]  Referring provider: Rosamond Leta NOVAK, MD [address] [place],  [place] [address]  Followup OAB   HPI: Ms Kaitlyn Santana is a 68yo here for followup for OAb and gross hematuria. She is currently on vesicare  5mg  BID. No gross hematuria. No issues with urinary urgency and she has rare urge incontinence. She uses 2 pads per day. She has nocturia 2x.    PMH: Past Medical History:  Diagnosis Date   Arthritis    Breast cancer (HCC) 02/2022   left breast IDC   Bronchitis    Cancer of left breast (HCC) 08/2003   GERD (gastroesophageal reflux disease)    HTN (hypertension)    Hyperlipidemia    Irritable bowel disease    Lumbar herniated disc    Other dyspnea and respiratory abnormality    Palpitations    Pre-diabetes    Rectal bleeding 08/10/2018    Surgical History: Past Surgical History:  Procedure Laterality Date   BREAST RECONSTRUCTION Right 04/10/2022   Procedure: Right Breast mastopexy for symmetry;  Surgeon: Lowery Estefana RAMAN, DO;  Location: [facility];  Service: Plastics;  Laterality: Right;   BREAST RECONSTRUCTION WITH PLACEMENT OF TISSUE EXPANDER AND FLEX HD (ACELLULAR HYDRATED DERMIS) Left 01/17/2022   Procedure: IMMEDIATE LEFT BREAST RECONSTRUCTION WITH PLACEMENT OF TISSUE EXPANDER AND FLEX HD (ACELLULAR HYDRATED DERMIS);  Surgeon: Lowery Estefana RAMAN, DO;  Location: [facility];  Service: Plastics;  Laterality: Left;   BREAST SURGERY     CANCER DIAG 08-2003   COLONOSCOPY WITH PROPOFOL  N/A 11/29/2020   Procedure: COLONOSCOPY WITH PROPOFOL ;  Surgeon: Golda Claudis PENNER, MD;  [facility];  Service: Endoscopy;  Laterality: N/A;  12:50   LUMBAR LAMINECTOMY/DECOMPRESSION MICRODISCECTOMY Right 10/31/2016   Procedure: Right Lumbar Five-Sacral One Lumbar laminotomy and microdiscectomy;  Surgeon: Alix Charleston, MD;  Location: [facility];  Service: Neurosurgery;  Laterality: Right;  Right    MASTECTOMY W/ SENTINEL NODE BIOPSY Left 01/17/2022   Procedure: LEFT TOTAL MASTECTOMY WITH SENTINEL NODE BIOPSY;  Surgeon: Vernetta Berg, MD;  Location: [facility];  Service: General;  Laterality: Left;  LMA   POLYPECTOMY  11/29/2020   Procedure: POLYPECTOMY;  Surgeon: Golda Claudis PENNER, MD;  [facility];  Service: Endoscopy;;   PORT-A-CATH REMOVAL Right 04/10/2022   Procedure: REMOVAL PORT-A-CATH;  Surgeon: Lowery Estefana RAMAN, DO;  Location: [facility];  Service: Plastics;  Laterality: Right;   PORTACATH PLACEMENT Right 01/17/2022   Procedure: INSERTION PORT-A-CATH;  Surgeon: Vernetta Berg, MD;  Location: [facility];  Service: General;  Laterality: Right;   REMOVAL OF TISSUE EXPANDER AND PLACEMENT OF IMPLANT Left 04/10/2022   Procedure: REMOVAL OF LEFT TISSUE EXPANDER AND PLACEMENT OF IMPLANT;  Surgeon: Lowery Estefana RAMAN, DO;  Location: [facility];  Service: Plastics;  Laterality: Left;   SHOULDER ARTHROSCOPY W/ LABRAL REPAIR Right    TOE SURGERY     VAGINAL HYSTERECTOMY      Home Medications:  Allergies as of 03/01/2024       Reactions   Bupropion  Itching   Ancef  [cefazolin  Sodium] Hives, Itching   Ciprofloxacin  Hives, Itching        Medication List        Accurate as of March 01, 2024  1:32 PM. If you have any questions, ask your nurse or doctor.          calcium carbonate 1250 (500 Ca)  MG chewable tablet Commonly known as: OS-CAL Chew 1 tablet by mouth daily.   CENTRUM SILVER 50+WOMEN PO Take 1 tablet by mouth daily.   diclofenac 75 MG EC tablet Commonly known as: VOLTAREN Take 75 mg by mouth daily.   Gemtesa  75 MG Tabs Generic drug: Vibegron  Take 1 tablet (75 mg total) by mouth daily.   magnesium  30 MG tablet Take 30 mg by mouth 2 (two) times daily.   metoprolol  succinate 50 MG 24 hr tablet Commonly known as: TOPROL -XL Take 50 mg by mouth daily.   pantoprazole  40 MG  tablet Commonly known as: PROTONIX  Take 40 mg by mouth daily.   rOPINIRole 0.5 MG tablet Commonly known as: REQUIP Take 0.5 mg by mouth at bedtime.   rosuvastatin 10 MG tablet Commonly known as: CRESTOR Take 10 mg by mouth daily.   solifenacin  5 MG tablet Commonly known as: VESICARE  TAKE ONE TABLET BY MOUTH TWICE DAILY   vitamin B-12 100 MCG tablet Commonly known as: CYANOCOBALAMIN Take 100 mcg by mouth daily.        Allergies:  Allergies  Allergen Reactions   Bupropion  Itching   Ancef  [Cefazolin  Sodium] Hives and Itching   Ciprofloxacin  Hives and Itching    Family History: Family History  Problem Relation Age of Onset   Anxiety disorder Mother    Depression Brother    Stroke Maternal Grandfather    Breast cancer Paternal Grandmother     Social History:  reports that she quit smoking about 15 years ago. Her smoking use included cigarettes. She started smoking about 50 years ago. She has a 35 pack-year smoking history. She has never used smokeless tobacco. She reports current alcohol  use. She reports that she does not use drugs.  ROS: All other review of systems were reviewed and are negative except what is noted above in HPI  Physical Exam: BP 101/65   Pulse 69   Constitutional:  Alert and oriented, No acute distress. HEENT: [place] AT, moist mucus membranes.  Trachea midline, no masses. Cardiovascular: No clubbing, cyanosis, or edema. Respiratory: Normal respiratory effort, no increased work of breathing. GI: Abdomen is soft, nontender, nondistended, no abdominal masses GU: No CVA tenderness.  Lymph: No cervical or inguinal lymphadenopathy. Skin: No rashes, bruises or suspicious lesions. Neurologic: Grossly intact, no focal deficits, moving all 4 extremities. Psychiatric: Normal mood and affect.  Laboratory Data: Lab Results  Component Value Date   WBC 9.2 10/03/2020   HGB 13.3 10/03/2020   HCT 41.8 10/03/2020   MCV 92.1 10/03/2020   PLT 266 10/03/2020     Lab Results  Component Value Date   CREATININE 0.49 10/03/2020    No results found for: PSA  No results found for: TESTOSTERONE  No results found for: HGBA1C  Urinalysis    Component Value Date/Time   APPEARANCEUR Clear 11/22/2022 1119   GLUCOSEU Negative 11/22/2022 1119   BILIRUBINUR Negative 11/22/2022 1119   PROTEINUR Negative 11/22/2022 1119   UROBILINOGEN 0.2 06/14/2020 1333   NITRITE Negative 11/22/2022 1119   LEUKOCYTESUR Negative 11/22/2022 1119    Lab Results  Component Value Date   LABMICR See below: 11/22/2022   WBCUA None seen 11/22/2022   LABEPIT 0-10 11/22/2022   BACTERIA None seen 11/22/2022    Pertinent Imaging:  No results found for this or any previous visit.  No results found for this or any previous visit.  No results found for this or any previous visit.  No results found for this or any previous  visit.  No results found for this or any previous visit.  No results found for this or any previous visit.  No results found for this or any previous visit.  No results found for this or any previous visit.   Assessment & Plan:    1. Urge incontinence (Primary) -continue vesicare  5mg  BID - Urinalysis, Routine w reflex microscopic  2. Gross hematuria -resolved.    No follow-ups on file.  Belvie Clara, MD  [facility] [place]

## 2024-04-23 DIAGNOSIS — E2839 Other primary ovarian failure: Secondary | ICD-10-CM | POA: Diagnosis not present

## 2024-05-14 DIAGNOSIS — Z79899 Other long term (current) drug therapy: Secondary | ICD-10-CM | POA: Diagnosis not present

## 2024-05-14 DIAGNOSIS — K219 Gastro-esophageal reflux disease without esophagitis: Secondary | ICD-10-CM | POA: Diagnosis not present

## 2024-05-14 DIAGNOSIS — R079 Chest pain, unspecified: Secondary | ICD-10-CM | POA: Diagnosis not present

## 2024-05-14 DIAGNOSIS — K529 Noninfective gastroenteritis and colitis, unspecified: Secondary | ICD-10-CM | POA: Diagnosis not present

## 2024-05-14 DIAGNOSIS — R0789 Other chest pain: Secondary | ICD-10-CM | POA: Diagnosis not present

## 2024-05-14 DIAGNOSIS — Z853 Personal history of malignant neoplasm of breast: Secondary | ICD-10-CM | POA: Diagnosis not present

## 2024-05-14 DIAGNOSIS — E785 Hyperlipidemia, unspecified: Secondary | ICD-10-CM | POA: Diagnosis not present

## 2024-07-10 ENCOUNTER — Encounter (HOSPITAL_COMMUNITY): Payer: Self-pay

## 2024-07-10 ENCOUNTER — Emergency Department (HOSPITAL_COMMUNITY)

## 2024-07-10 ENCOUNTER — Emergency Department (HOSPITAL_COMMUNITY)
Admission: EM | Admit: 2024-07-10 | Discharge: 2024-07-10 | Disposition: A | Discharge status: Home / Self Care | Attending: Emergency Medicine | Admitting: Emergency Medicine

## 2024-07-10 DIAGNOSIS — I10 Essential (primary) hypertension: Secondary | ICD-10-CM | POA: Insufficient documentation

## 2024-07-10 DIAGNOSIS — W540XXA Bitten by dog, initial encounter: Secondary | ICD-10-CM | POA: Insufficient documentation

## 2024-07-10 DIAGNOSIS — Y93K9 Activity, other involving animal care: Secondary | ICD-10-CM | POA: Insufficient documentation

## 2024-07-10 DIAGNOSIS — S61511A Laceration without foreign body of right wrist, initial encounter: Secondary | ICD-10-CM | POA: Insufficient documentation

## 2024-07-10 DIAGNOSIS — Z23 Encounter for immunization: Secondary | ICD-10-CM | POA: Insufficient documentation

## 2024-07-10 DIAGNOSIS — Z853 Personal history of malignant neoplasm of breast: Secondary | ICD-10-CM | POA: Insufficient documentation

## 2024-07-10 DIAGNOSIS — Z79899 Other long term (current) drug therapy: Secondary | ICD-10-CM | POA: Insufficient documentation

## 2024-07-10 MED ORDER — AMOXICILLIN-POT CLAVULANATE 875-125 MG PO TABS
1.0000 | ORAL_TABLET | Freq: Once | ORAL | Status: AC
  Administered 2024-07-10: 1 via ORAL
  Filled 2024-07-10: qty 1

## 2024-07-10 MED ORDER — AMOXICILLIN-POT CLAVULANATE 875-125 MG PO TABS: 1.0000 | ORAL_TABLET | Freq: Two times a day (BID) | ORAL | 0 refills | Status: DC

## 2024-07-10 MED ORDER — AMOXICILLIN-POT CLAVULANATE 875-125 MG PO TABS: 1.0000 | ORAL_TABLET | Freq: Two times a day (BID) | ORAL | 0 refills | Status: AC

## 2024-07-10 MED ORDER — DIPHTH-ACELL PERTUSSIS-TETANUS 25-58-10 LF-MCG/0.5 IM SUSP
0.5000 mL | Freq: Once | INTRAMUSCULAR | Status: AC
  Administered 2024-07-10: 0.5 mL via INTRAMUSCULAR
  Filled 2024-07-10: qty 0.5

## 2024-07-10 NOTE — ED Triage Notes (Addendum)
 Pt tried to  break up a dog fight and got bit on right wrist. Pt has laceration to left wrist. Dogs were updated on shots. Minimal bleeding from wrist at this time. Bite appears like a laceration. No puncture wounds

## 2024-07-10 NOTE — ED Provider Notes (Signed)
 " [place] [facility]MENT AT [facility] Provider Note  CSN: [id number] Arrival date & time: 07/10/24 1510  Chief Complaint(s) Animal Bite  HPI Kaitlyn Santana is a 69 y.o. female with past medical history as below, significant for HTN, GERD, IBD, HLD who presents to the ED with complaint of dog bite  Pt here after she was bitten by family dog, was breaking up a fight between dogs and her right wrist was bitten. Dog is UTD rabies, she is unsure last tetanus. She is LHD. Pain only to right wrist. No pain to elbow or fingers. Denies other injuries. No other complaints   Past Medical History Past Medical History:  Diagnosis Date   Arthritis    Breast cancer (HCC) 02/2022   left breast IDC   Bronchitis    Cancer of left breast (HCC) 08/2003   GERD (gastroesophageal reflux disease)    HTN (hypertension)    Hyperlipidemia    Irritable bowel disease    Lumbar herniated disc    Other dyspnea and respiratory abnormality    Palpitations    Pre-diabetes    Rectal bleeding 08/10/2018   Patient Active Problem List   Diagnosis Date Noted   Breast asymmetry following reconstructive surgery 07/02/2022   Malignant neoplasm of upper-outer quadrant of left breast in female, estrogen receptor negative (HCC) 12/13/2021   Urge incontinence 06/14/2020   Gross hematuria 06/14/2020   Rectal bleeding 08/10/2018   HNP (herniated nucleus pulposus), lumbar 10/31/2016   Depression, major, recurrent 02/17/2013   IBS (irritable bowel syndrome) 03/10/2012   Diarrhea 03/10/2012   Abnormal echocardiogram 10/30/2010   Palpitations    HTN (hypertension)    Home Medication(s) Prior to Admission medications  Medication Sig Start Date End Date Taking? Authorizing Provider  amoxicillin -clavulanate (AUGMENTIN ) 875-125 MG tablet Take 1 tablet by mouth every 12 (twelve) hours. 07/11/24   Elnor Jayson DELENA, DO  calcium carbonate (OS-CAL) 1250 (500 Ca) MG chewable tablet Chew 1 tablet by mouth daily.     [provider]  diclofenac (VOLTAREN) 75 MG EC tablet Take 75 mg by mouth daily. 09/16/16   [provider]  magnesium  30 MG tablet Take 30 mg by mouth 2 (two) times daily.    [provider]  metoprolol  (TOPROL -XL) 50 MG 24 hr tablet Take 50 mg by mouth daily.      [provider]  Multiple Vitamins-Minerals (CENTRUM SILVER 50+WOMEN PO) Take 1 tablet by mouth daily.    [provider]  pantoprazole  (PROTONIX ) 40 MG tablet Take 40 mg by mouth daily.  02/21/15   [provider]  rOPINIRole (REQUIP) 0.5 MG tablet Take 0.5 mg by mouth at bedtime. 05/31/20   [provider]  rosuvastatin (CRESTOR) 10 MG tablet Take 10 mg by mouth daily. 05/31/20   [provider]  solifenacin  (VESICARE ) 5 MG tablet Take 1 tablet (5 mg total) by mouth 2 (two) times daily. 03/01/24   McKenzie, Belvie CROME, MD  Vibegron  (GEMTESA ) 75 MG TABS Take 1 tablet (75 mg total) by mouth daily. 11/22/22   McKenzie, Belvie CROME, MD  vitamin B-12 (CYANOCOBALAMIN) 100 MCG tablet Take 100 mcg by mouth daily.    [provider]  Past Surgical History Past Surgical History:  Procedure Laterality Date   BREAST RECONSTRUCTION Right 04/10/2022   Procedure: Right Breast mastopexy for symmetry;  Surgeon: Lowery Estefana RAMAN, DO;  Location: [facility];  Service: Plastics;  Laterality: Right;   BREAST RECONSTRUCTION WITH PLACEMENT OF TISSUE EXPANDER AND FLEX HD (ACELLULAR HYDRATED DERMIS) Left 01/17/2022   Procedure: IMMEDIATE LEFT BREAST RECONSTRUCTION WITH PLACEMENT OF TISSUE EXPANDER AND FLEX HD (ACELLULAR HYDRATED DERMIS);  Surgeon: Lowery Estefana RAMAN, DO;  Location: [facility];  Service: Plastics;  Laterality: Left;   BREAST SURGERY     CANCER DIAG 08-2003   COLONOSCOPY WITH PROPOFOL  N/A 11/29/2020    Procedure: COLONOSCOPY WITH PROPOFOL ;  Surgeon: Golda Claudis PENNER, MD;  [facility];  Service: Endoscopy;  Laterality: N/A;  12:50   LUMBAR LAMINECTOMY/DECOMPRESSION MICRODISCECTOMY Right 10/31/2016   Procedure: Right Lumbar Five-Sacral One Lumbar laminotomy and microdiscectomy;  Surgeon: Alix Charleston, MD;  Location: [facility];  Service: Neurosurgery;  Laterality: Right;  Right   MASTECTOMY W/ SENTINEL NODE BIOPSY Left 01/17/2022   Procedure: LEFT TOTAL MASTECTOMY WITH SENTINEL NODE BIOPSY;  Surgeon: Vernetta Berg, MD;  Location: [facility];  Service: General;  Laterality: Left;  LMA   POLYPECTOMY  11/29/2020   Procedure: POLYPECTOMY;  Surgeon: Golda Claudis PENNER, MD;  Location: [facility];  Service: Endoscopy;;   PORT-A-CATH REMOVAL Right 04/10/2022   Procedure: REMOVAL PORT-A-CATH;  Surgeon: Lowery Estefana RAMAN, DO;  Location: [facility];  Service: Plastics;  Laterality: Right;   PORTACATH PLACEMENT Right 01/17/2022   Procedure: INSERTION PORT-A-CATH;  Surgeon: Vernetta Berg, MD;  Location: [facility];  Service: General;  Laterality: Right;   REMOVAL OF TISSUE EXPANDER AND PLACEMENT OF IMPLANT Left 04/10/2022   Procedure: REMOVAL OF LEFT TISSUE EXPANDER AND PLACEMENT OF IMPLANT;  Surgeon: Lowery Estefana RAMAN, DO;  Location:  SURGERY CENTER;  Service: Plastics;  Laterality: Left;   SHOULDER ARTHROSCOPY W/ LABRAL REPAIR Right    TOE SURGERY     VAGINAL HYSTERECTOMY     Family History Family History  Problem Relation Age of Onset   Anxiety disorder Mother    Depression Brother    Stroke Maternal Grandfather    Breast cancer Paternal Grandmother     Social History Social History[1] Allergies Bupropion , Ancef  [cefazolin  sodium], and Ciprofloxacin   Review of Systems A thorough review of systems was obtained and all systems are negative except as noted in the HPI and PMH.   Physical Exam Vital Signs  I have  reviewed the triage vital signs BP (!) 138/108   Pulse 85   Temp 98.5 F (36.9 C) (Oral)   Resp 18   SpO2 96%  Physical Exam Vitals and nursing note reviewed.  Constitutional:      General: She is not in acute distress.    Appearance: Normal appearance. She is well-developed. She is not ill-appearing.  HENT:     Head: Normocephalic and atraumatic.     Right Ear: External ear normal.     Left Ear: External ear normal.     Nose: Nose normal.     Mouth/Throat:     Mouth: Mucous membranes are moist.  Eyes:     General: No scleral icterus.       Right eye: No discharge.        Left eye: No discharge.  Cardiovascular:     Rate and Rhythm: Normal rate.  Pulmonary:     Effort:  Pulmonary effort is normal. No respiratory distress.     Breath sounds: No stridor.  Abdominal:     General: Abdomen is flat. There is no distension.     Tenderness: There is no guarding.  Musculoskeletal:        General: No deformity.       Arms:     Cervical back: No rigidity.     Comments: Approx 2 cm linear laceration noted to right wrist, no active bleeding, no visualized fb. RUE is NVI. No pain w/ palpation or movement of right elbow. Cap refill is brisk.   Skin:    General: Skin is warm and dry.     Coloration: Skin is not cyanotic, jaundiced or pale.  Neurological:     Mental Status: She is alert.  Psychiatric:        Speech: Speech normal.        Behavior: Behavior normal. Behavior is cooperative.     ED Results and Treatments Labs (all labs ordered are listed, but only abnormal results are displayed) Labs Reviewed - No data to display                                                                                                                        Radiology DG Wrist Complete Right Result Date: 07/10/2024 CLINICAL DATA:  Dog bite, laceration EXAM: RIGHT WRIST - COMPLETE 3+ VIEW COMPARISON:  None Available. FINDINGS: Frontal, oblique, lateral, and ulnar deviated views of the right  wrist are obtained on 4 images. No acute displaced fracture, subluxation, or dislocation. There is diffuse osteoarthritis, most pronounced at the first carpometacarpal joint. Mild diffuse soft tissue swelling. No radiopaque foreign body. IMPRESSION: 1. No fracture or radiopaque foreign body. 2. Soft tissue swelling. 3. Multifocal osteoarthritis greatest at the base of thumb. Electronically Signed   By: Ozell Daring M.D.   On: 07/10/2024 16:29    Pertinent labs & imaging results that were available during my care of the patient were reviewed by me and considered in my medical decision making (see MDM for details).  Medications Ordered in ED Medications  diphtheria-acellular pertussis-tetanus (INFANRIX) injection 0.5 mL (0.5 mLs Intramuscular Given 07/10/24 1604)  amoxicillin -clavulanate (AUGMENTIN ) 875-125 MG per tablet 1 tablet (1 tablet Oral Given 07/10/24 1559)  Procedures Procedures  (including critical care time)  Medical Decision Making / ED Course    Medical Decision Making:    Kaitlyn Santana is a 69 y.o. female with past medical history as below, significant for HTN, GERD, IBD, HLD who presents to the ED with complaint of dog bite. The complaint involves an extensive differential diagnosis and also carries with it a high risk of complications and morbidity.  Serious etiology was considered. Ddx includes but is not limited to: fx, dislocation, fb, cellulitis, laceration, etc  Complete initial physical exam performed, notably the patient was in nad.    Reviewed and confirmed nursing documentation for past medical history, family history, social history.  Vital signs reviewed.    Dog bite Laceration > - wound care performed, cleaned thoroughly by nurse - wound is fairly small, will keep open - dog UTD rabies per family - update pt tetanus - XR  stable - augmentin  for home - wound care instructions  4:49 PM:  I have discussed the diagnosis/risks/treatment options with the patient and family.  Evaluation and diagnostic testing in the emergency department does not suggest an emergent condition requiring admission or immediate intervention beyond what has been performed at this time.  They will follow up with pcp. We also discussed returning to the ED immediately if new or worsening sx occur. We discussed the sx which are most concerning (e.g., sudden worsening pain, fever, inability to tolerate by mouth) that necessitate immediate return.    The patient appears reasonably screened and/or stabilized for discharge and I doubt any other medical condition or other [facility] requiring further screening, evaluation, or treatment in the ED at this time prior to discharge.     Clinical Course as of 07/10/24 1649  Sat Jul 10, 2024  1633 XR neg [SG]    Clinical Course User Index [SG] Elnor Jayson DELENA, DO                    Additional history obtained: -Additional history obtained from family -External records from outside source obtained and reviewed including: Chart review including previous notes, labs, imaging, consultation notes including  allergies   Lab Tests: na  EKG   EKG Interpretation Date/Time:    Ventricular Rate:    PR Interval:    QRS Duration:    QT Interval:    QTC Calculation:   R Axis:      Text Interpretation:           Imaging Studies ordered: I ordered imaging studies including wrist xr I independently visualized the following imaging with scope of interpretation limited to determining acute life threatening conditions related to emergency care; findings noted above I agree with the radiologist interpretation If any imaging was obtained with contrast I closely monitored patient for any possible adverse reaction a/w contrast administration in the emergency department   Medicines ordered and  prescription drug management: Meds ordered this encounter  Medications   diphtheria-acellular pertussis-tetanus (INFANRIX) injection 0.5 mL   amoxicillin -clavulanate (AUGMENTIN ) 875-125 MG per tablet 1 tablet   DISCONTD: amoxicillin -clavulanate (AUGMENTIN ) 875-125 MG tablet    Sig: Take 1 tablet by mouth every 12 (twelve) hours.    Dispense:  14 tablet    Refill:  0   amoxicillin -clavulanate (AUGMENTIN ) 875-125 MG tablet    Sig: Take 1 tablet by mouth every 12 (twelve) hours.    Dispense:  14 tablet    Refill:  0    -I have reviewed the patients home  medicines and have made adjustments as needed   Consultations Obtained: na   Cardiac Monitoring: Continuous pulse oximetry interpreted by myself, 100% on RA.    Social Determinants of Health:  Diagnosis or treatment significantly limited by social determinants of health: former smoker   Reevaluation: After the interventions noted above, I reevaluated the patient and found that they have improved  Co morbidities that complicate the patient evaluation  Past Medical History:  Diagnosis Date   Arthritis    Breast cancer (HCC) 02/2022   left breast IDC   Bronchitis    Cancer of left breast (HCC) 08/2003   GERD (gastroesophageal reflux disease)    HTN (hypertension)    Hyperlipidemia    Irritable bowel disease    Lumbar herniated disc    Other dyspnea and respiratory abnormality    Palpitations    Pre-diabetes    Rectal bleeding 08/10/2018      Dispostion: Disposition decision including need for hospitalization was considered, and patient discharged from emergency department.    Final Clinical Impression(s) / ED Diagnoses Final diagnoses:  Dog bite, initial encounter         [1]  Social History Tobacco Use   Smoking status: Former    Current packs/day: 0.00    Average packs/day: 1 pack/day for 35.0 years (35.0 ttl pk-yrs)    Types: Cigarettes    Start date: 05/10/1973    Quit date: 05/10/2008    Years  since quitting: 16.1   Smokeless tobacco: Never   Tobacco comments:    Quit in 2009  Vaping Use   Vaping status: Never Used  Substance Use Topics   Alcohol  use: Yes    Comment: occasional   Drug use: No     Elnor Jayson LABOR, DO 07/10/24 1649  "

## 2024-07-10 NOTE — Discharge Instructions (Addendum)
 It was a pleasure caring for you today in the emergency department.  Please clean the wound with gentle soap and water twice daily.  Please change the bandage after cleaning.  Do not submerge the wound underwater until the wound has healed.  That means no hot tubs, swimming pools, lakes or streams until the wound is healed.  If you wash dishes use thick rubber gloves.  Take medications as prescribed.  You can take over-the-counter acetaminophen  or ibuprofen  as needed for discomfort per instructions on the bottle  Please follow-up with your PCP  Please return to the emergency department for any worsening or worrisome symptoms.

## 2025-03-02 ENCOUNTER — Ambulatory Visit: Admitting: Urology
